# Patient Record
Sex: Male | Born: 1965 | Race: White | Hispanic: No | Marital: Married | Smoking: Never smoker
Health system: Southern US, Community
[De-identification: ages and names within clinical notes are randomized; demographics above are authoritative.]

## PROBLEM LIST (undated history)

## (undated) DIAGNOSIS — E785 Hyperlipidemia, unspecified: Secondary | ICD-10-CM

## (undated) DIAGNOSIS — I10 Essential (primary) hypertension: Secondary | ICD-10-CM

---

## 2007-02-28 ENCOUNTER — Emergency Department (HOSPITAL_COMMUNITY): Admission: EM | Admit: 2007-02-28 | Discharge: 2007-02-28 | Payer: Self-pay | Admitting: Emergency Medicine

## 2007-02-28 IMAGING — CR DG CHEST 2V
2 series · 2 of 2 positions shown · non-contrast
Comparison: None available.

CLINICAL DATA: Chest pain and dyspnea. 
 CHEST ? 2 VIEW:

[w chest pa]
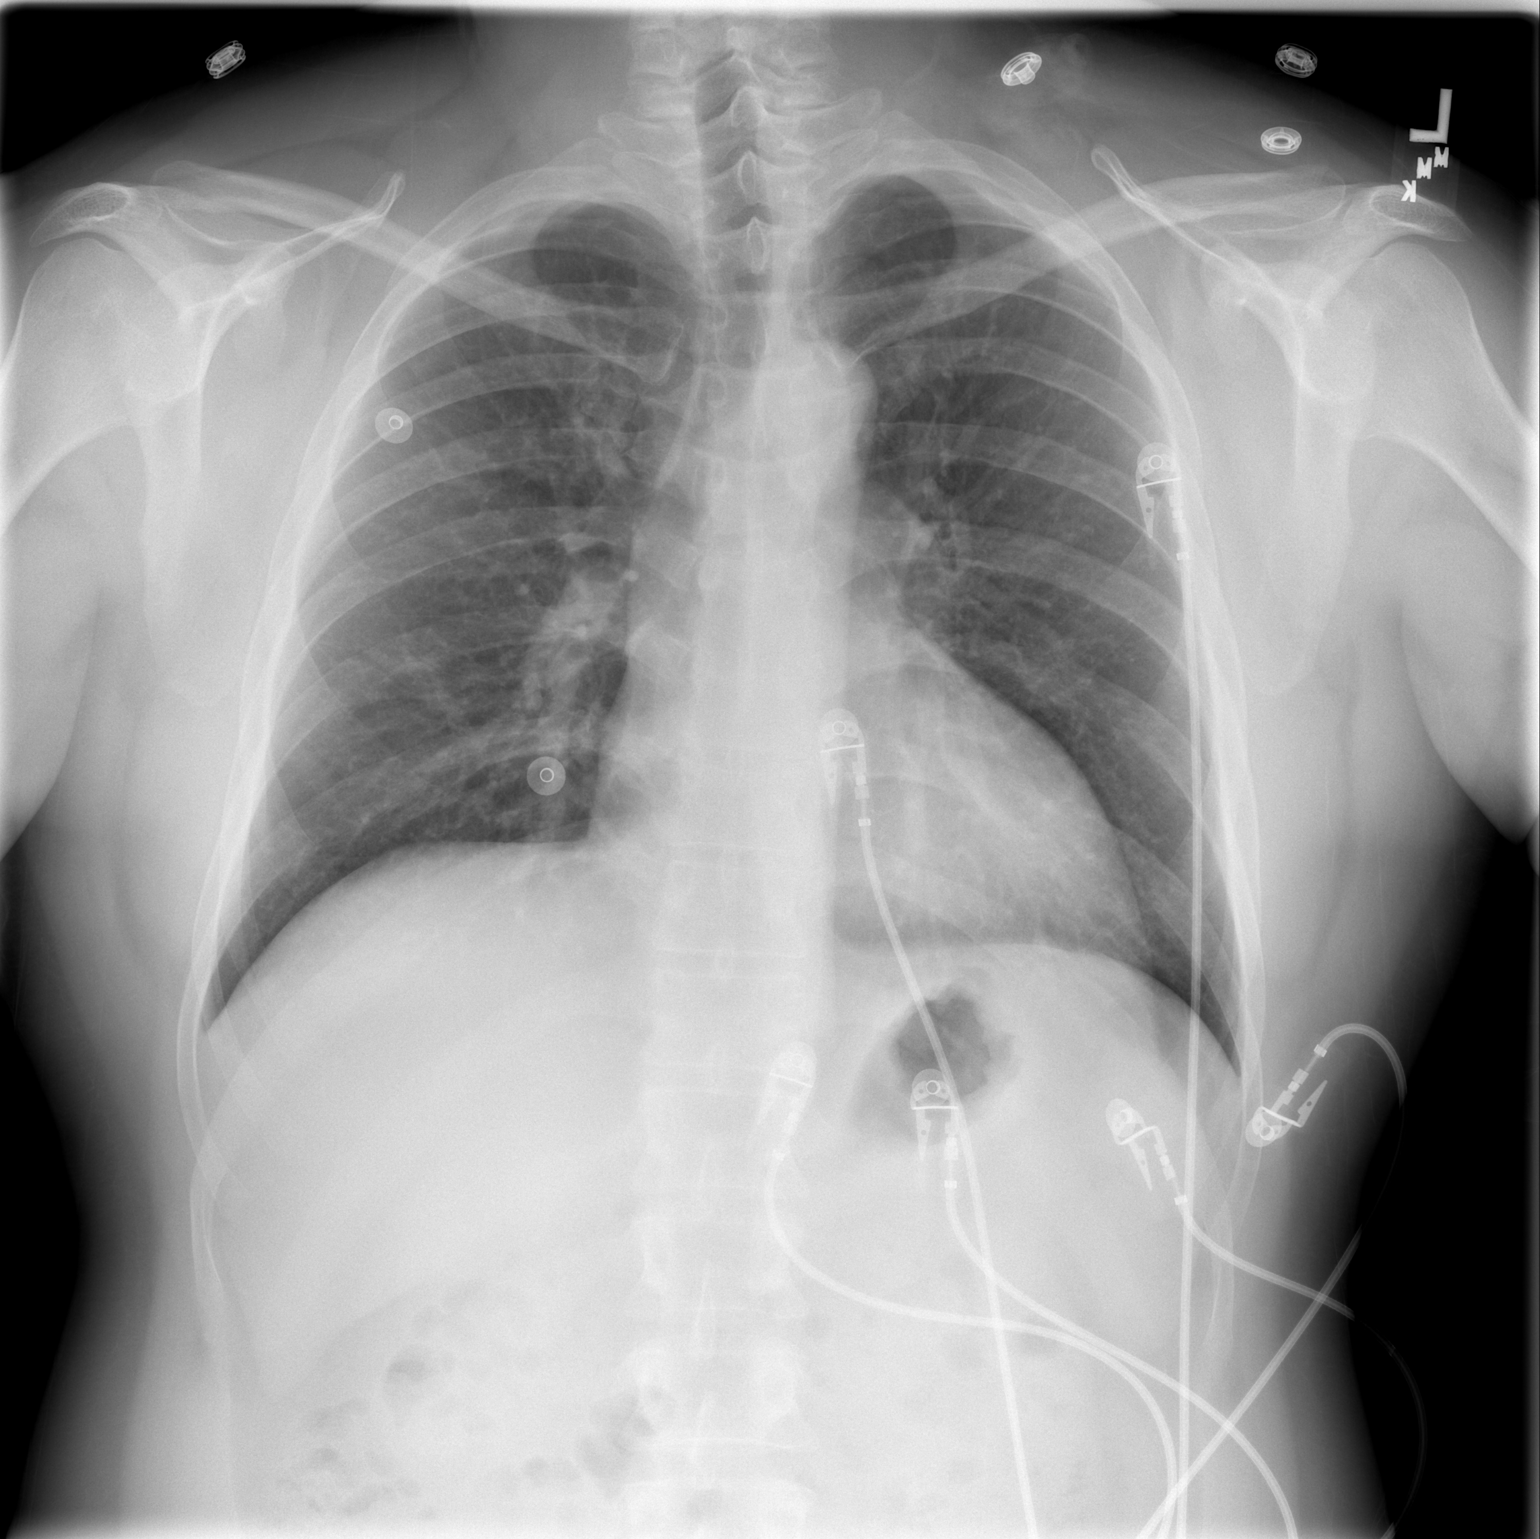

[w chest lat]
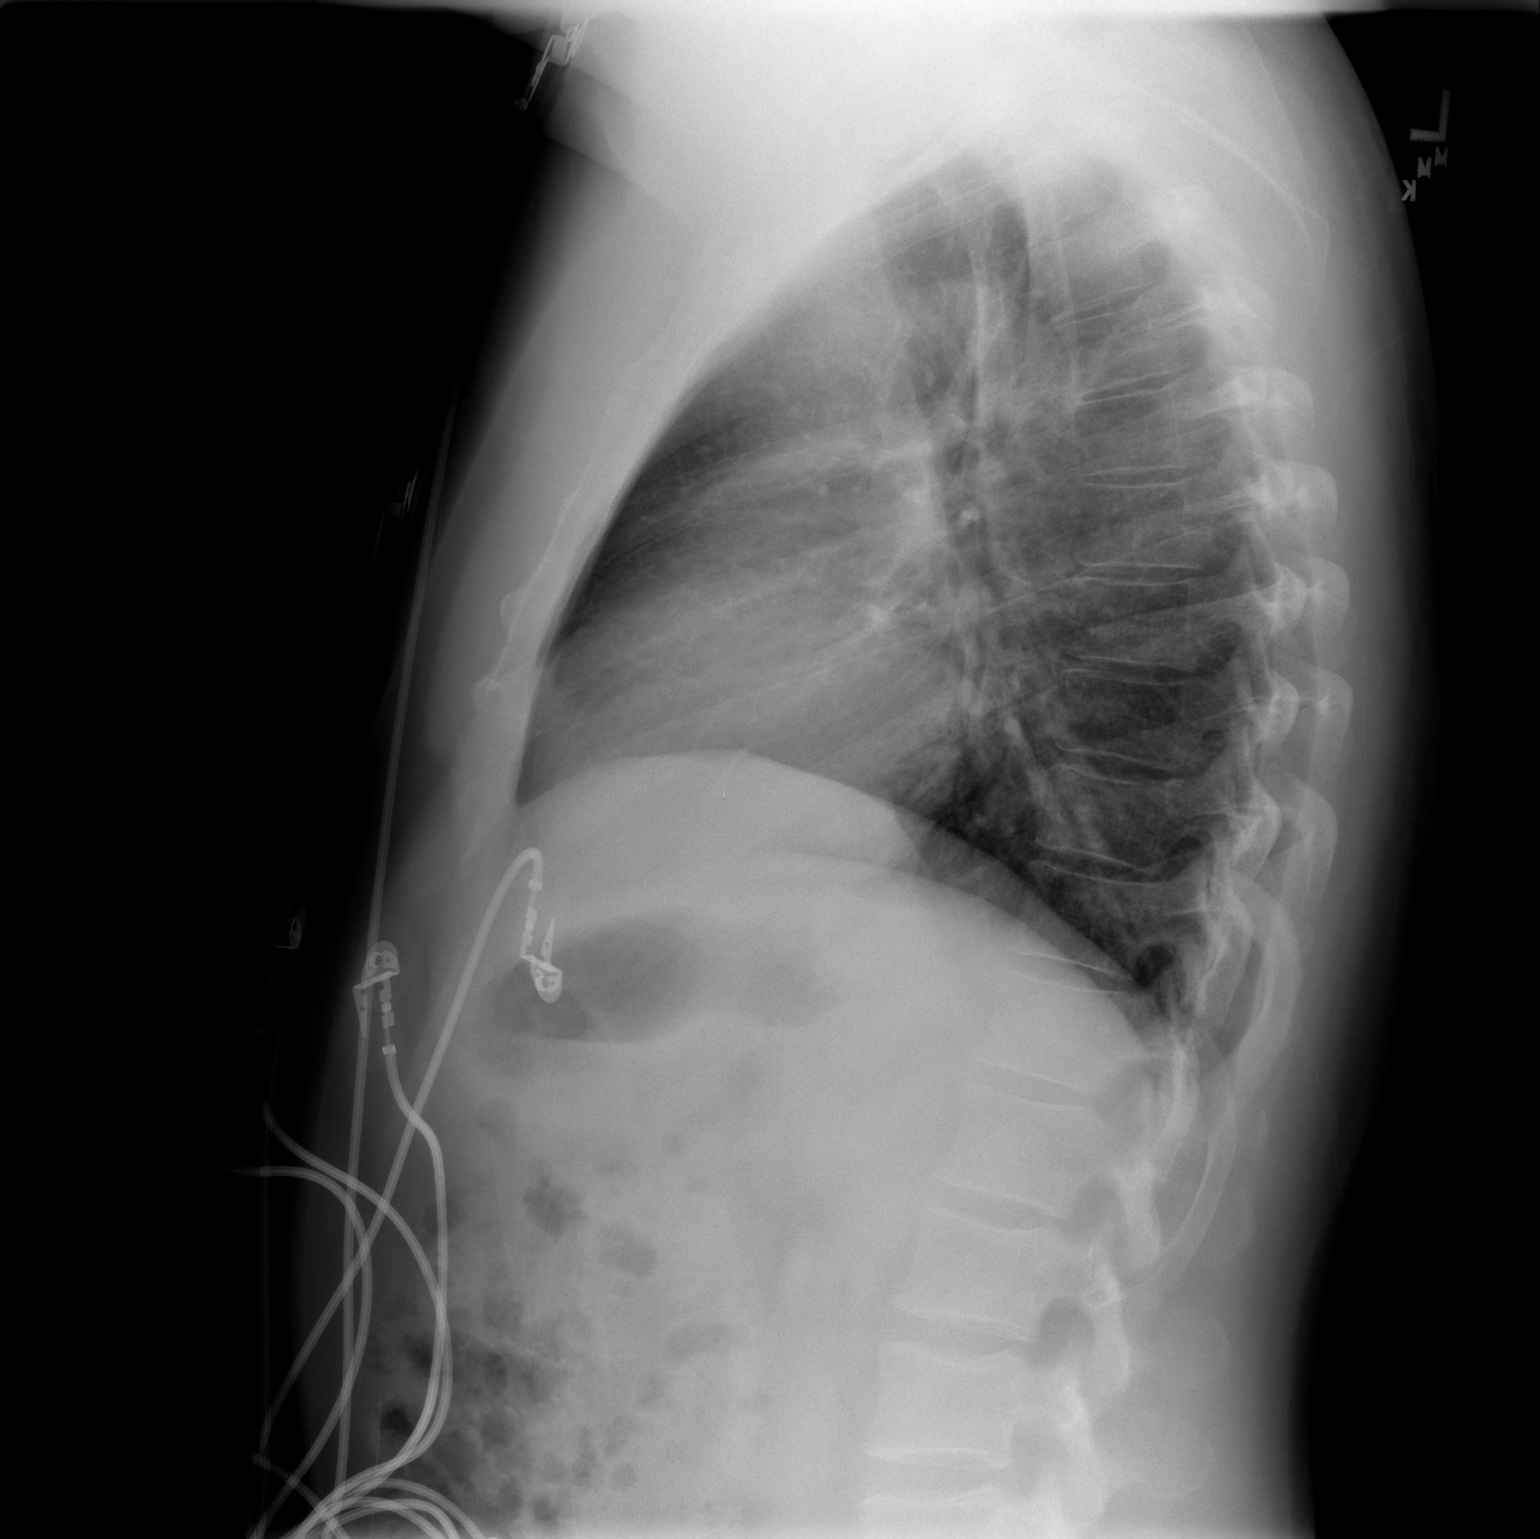

[2 of 2 positions shown; findings below may reference images not displayed]

FINDINGS: Mild cardiomegaly and mild peribronchial thickening are noted without focal airspace disease, pleural effusions or pneumothorax.  Lungs are otherwise clear.  The visualized bony thorax and upper abdomen are within normal limits.
IMPRESSION: Mild cardiomegaly and peribronchial thickening.

## 2011-03-18 LAB — CBC
Hemoglobin: 14.9
MCHC: 34.7
RDW: 12.5

## 2011-03-18 LAB — D-DIMER, QUANTITATIVE: D-Dimer, Quant: 0.3

## 2011-03-18 LAB — I-STAT 8, (EC8 V) (CONVERTED LAB)
Glucose, Bld: 99
Potassium: 4.4
Sodium: 139
TCO2: 27

## 2011-03-18 LAB — POCT I-STAT CREATININE: Operator id: 294501

## 2011-03-18 LAB — POCT CARDIAC MARKERS: Operator id: 294501

## 2011-06-07 ENCOUNTER — Other Ambulatory Visit: Payer: Self-pay | Admitting: Family Medicine

## 2011-06-07 DIAGNOSIS — R7989 Other specified abnormal findings of blood chemistry: Secondary | ICD-10-CM

## 2011-06-11 ENCOUNTER — Ambulatory Visit
Admission: RE | Admit: 2011-06-11 | Discharge: 2011-06-11 | Disposition: A | Discharge status: Home / Self Care | Payer: Self-pay | Source: Ambulatory Visit | Attending: Family Medicine | Admitting: Family Medicine

## 2011-06-11 DIAGNOSIS — R7989 Other specified abnormal findings of blood chemistry: Secondary | ICD-10-CM

## 2011-06-11 IMAGING — US US ABDOMEN COMPLETE
1 series · 14 of 25 positions shown · non-contrast
Comparison: None.

CLINICAL DATA: Elevated liver function tests.  The patient takes
cholesterol lowering medication (presumably a statin).

COMPLETE ABDOMINAL ULTRASOUND

[Series 1: us abdomen complete · 0.29mm/px · 14 of 70 slices shown]
[im 1/70]
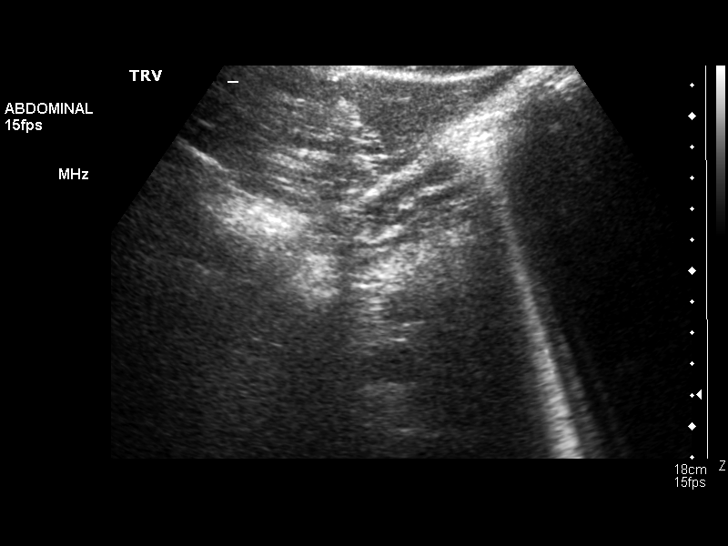
[im 6/70]
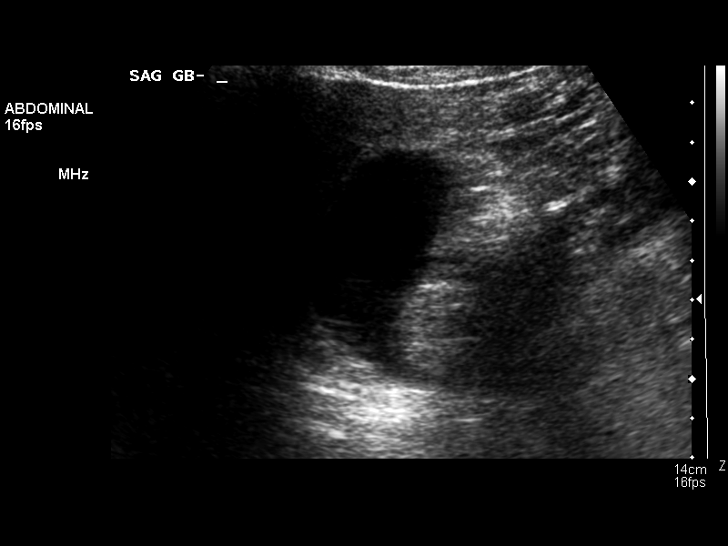
[im 12/70]
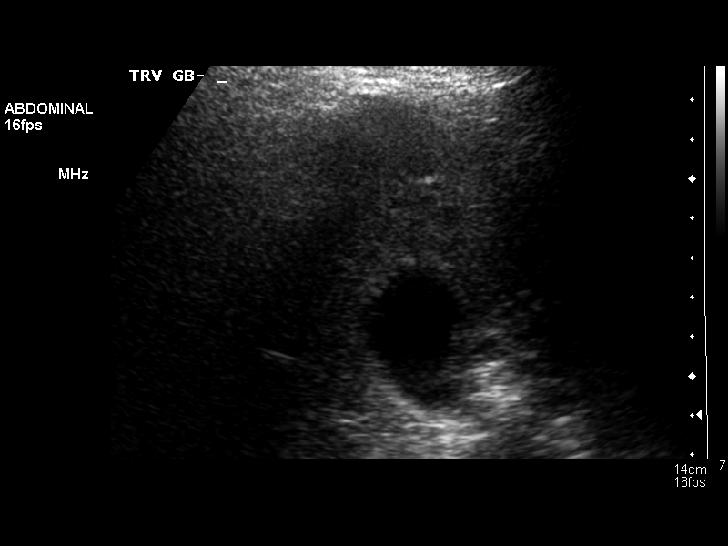
[im 18/70]
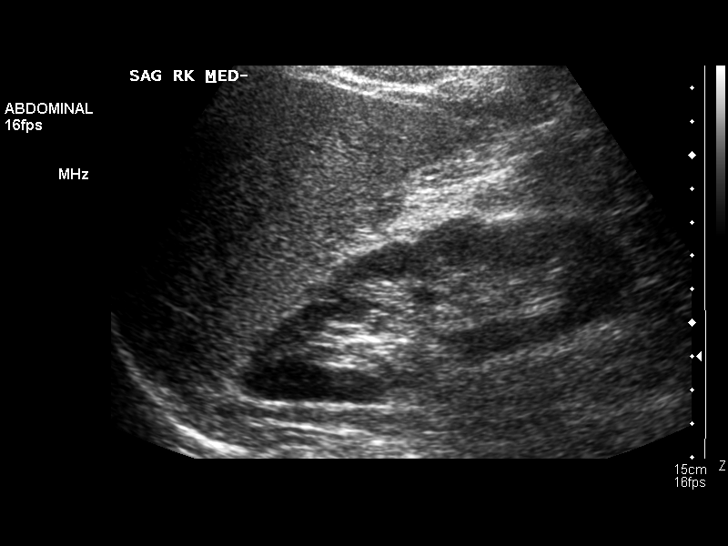
[im 24/70]
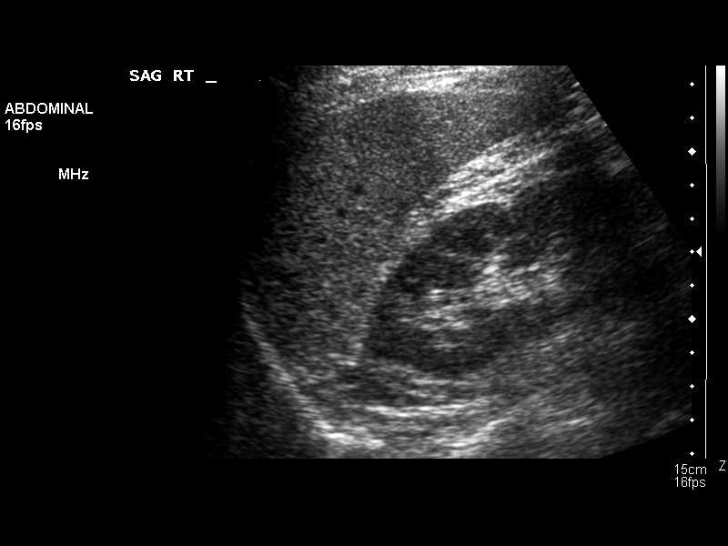
[im 26/70]
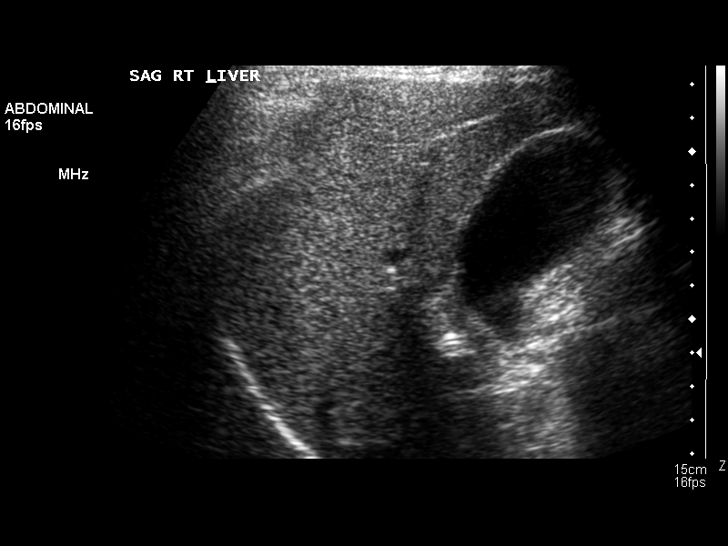
[im 32/70]
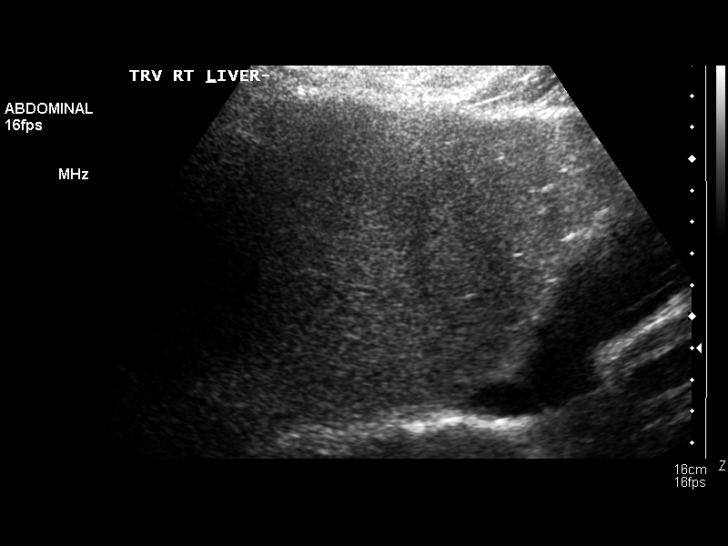
[im 38/70]
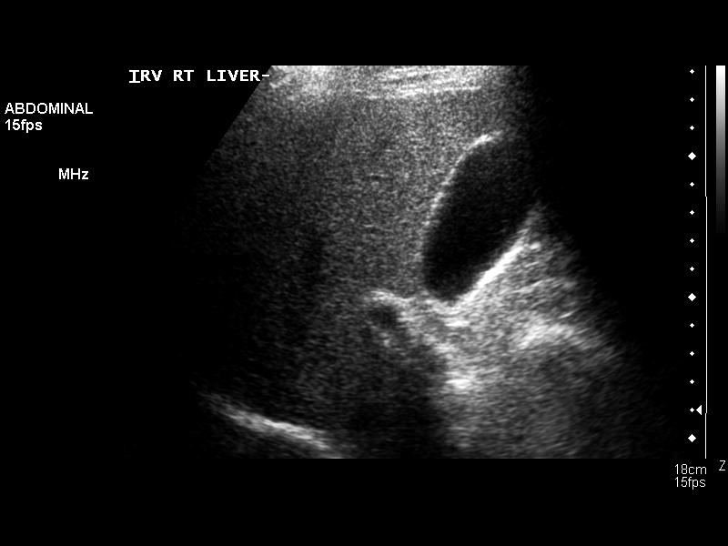
[im 44/70]
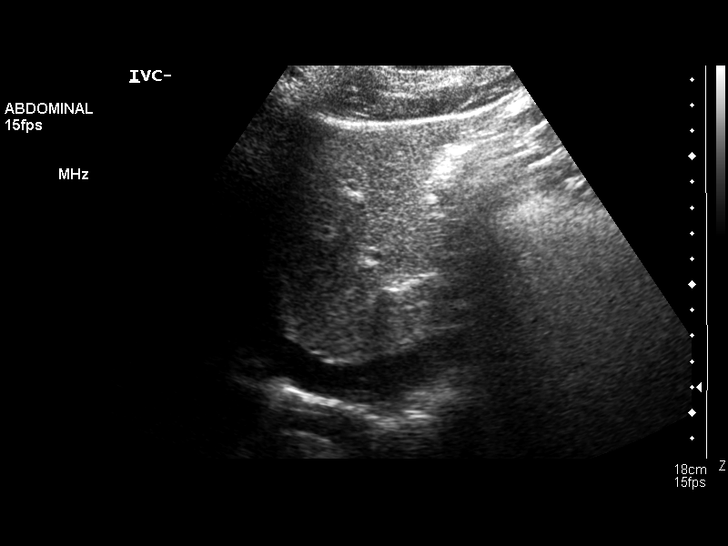
[im 47/70]
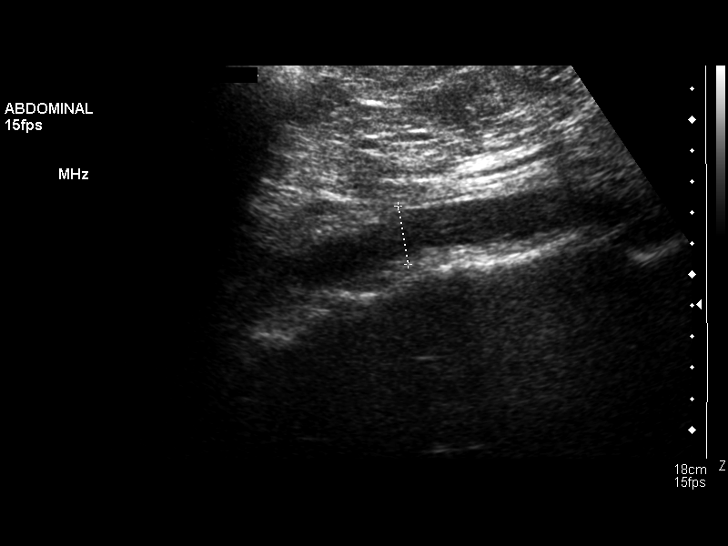
[im 52/70]
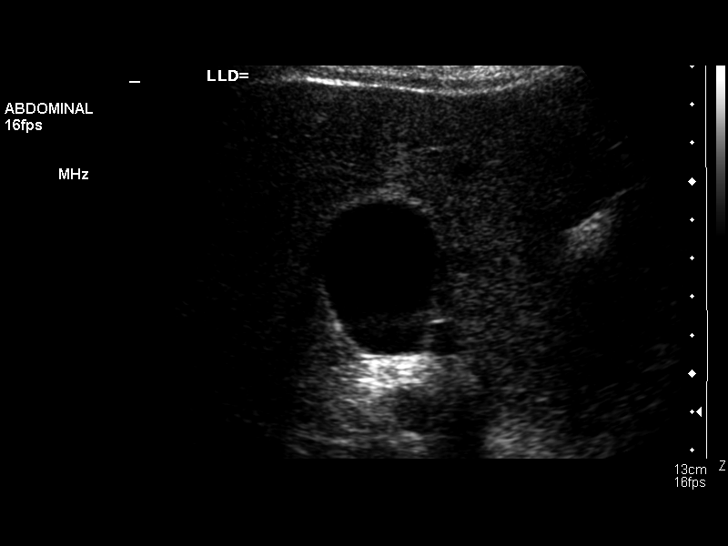
[im 58/70]
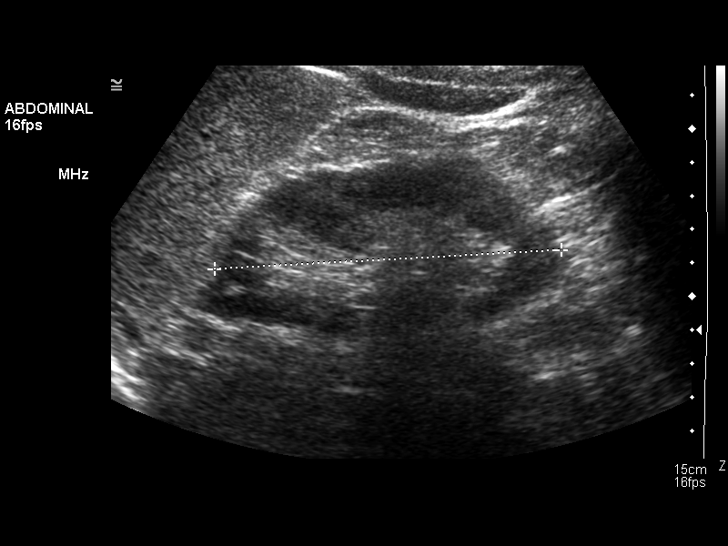
[im 64/70]
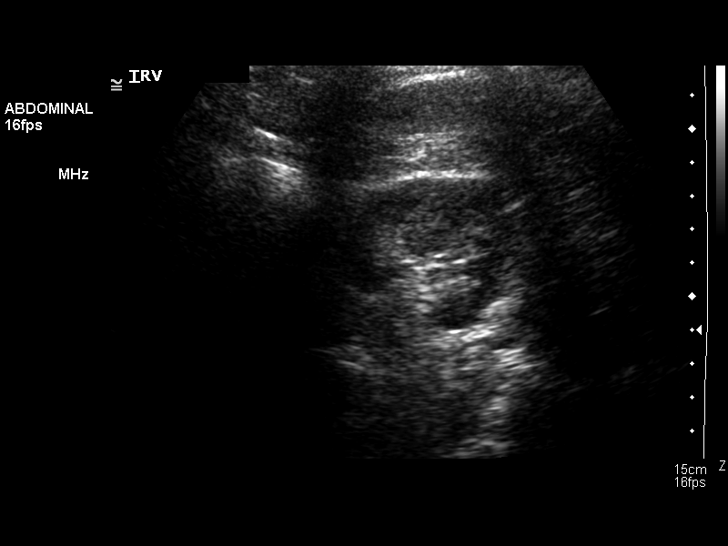
[im 70/70]
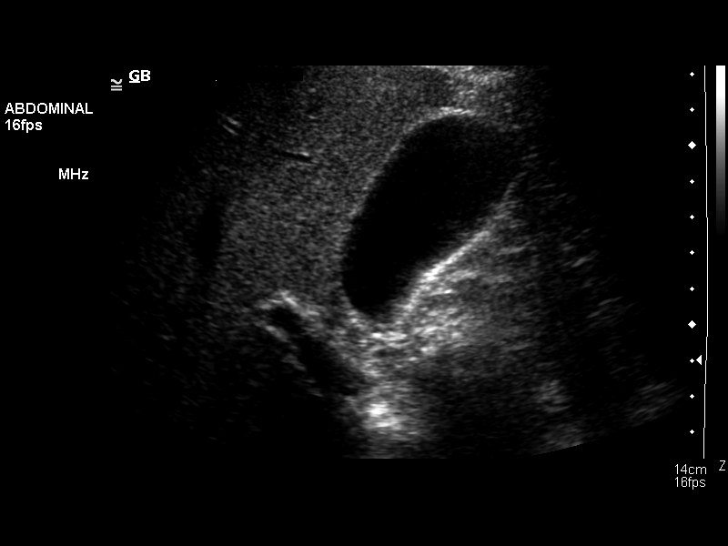

[14 of 25 positions shown; findings below may reference images not displayed]

FINDINGS: Gallbladder:  No gallstones, gallbladder wall thickening, or
pericholecystic fluid.

Common bile duct:  Measures 4 mm proximally, normal where
visualized.

Liver:  Mild coarsening of echotexture and header echogenicity,
possibly from mild hepatic steatosis.  No marginal nodularity or
focal abnormality is observed.

IVC:  Appears normal.

Pancreas:  Not well seen due to overlying bowel gas.

Spleen:  Measures 5.5 cm craniocaudad and appears normal.

Right Kidney:  Measures 12.2 cm craniocaudad and appears normal.

Left Kidney:  Measures 10.5 cm craniocaudad and appears normal.

Abdominal aorta:  No aneurysm identified.
IMPRESSION: 1.  Mildly coarsened echotexture of the liver, without nodularity
or focal lesion.  This could be from mild diffuse hepatic
steatosis.

## 2016-07-08 DIAGNOSIS — Z23 Encounter for immunization: Secondary | ICD-10-CM | POA: Diagnosis not present

## 2016-07-18 DIAGNOSIS — J45909 Unspecified asthma, uncomplicated: Secondary | ICD-10-CM | POA: Diagnosis not present

## 2016-07-18 DIAGNOSIS — R05 Cough: Secondary | ICD-10-CM | POA: Diagnosis not present

## 2016-10-13 DIAGNOSIS — Z0000 Encounter for general adult medical examination without abnormal findings: Secondary | ICD-10-CM | POA: Diagnosis not present

## 2016-10-15 DIAGNOSIS — Z0000 Encounter for general adult medical examination without abnormal findings: Secondary | ICD-10-CM | POA: Diagnosis not present

## 2016-10-28 IMAGING — US US ABDOMEN COMPLETE
1 series · 13 of 25 positions shown · non-contrast
Comparison: Abdominal ultrasound [DATE]

CLINICAL DATA: Episodica acute superior mid epigastric and back
pain for the past 2 months.

EXAM:
ABDOMEN ULTRASOUND COMPLETE

[Series 1: us abdomen complete · 0.15mm/px · 13 of 72 slices shown]
[im 1/72]
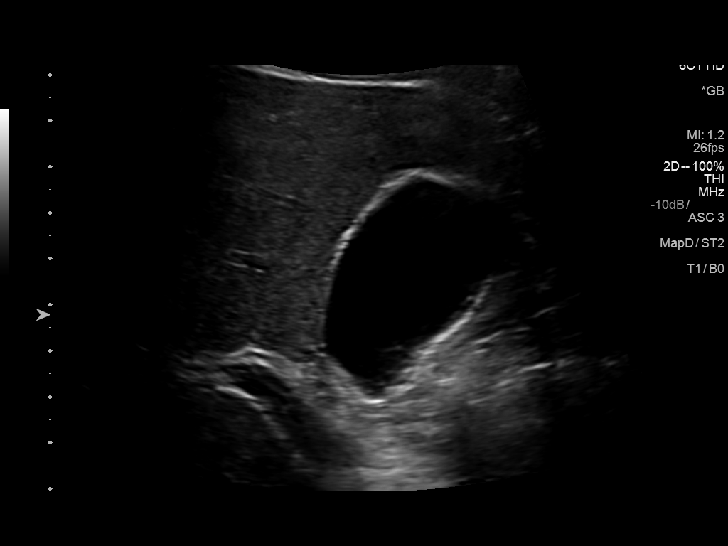
[im 6/72]
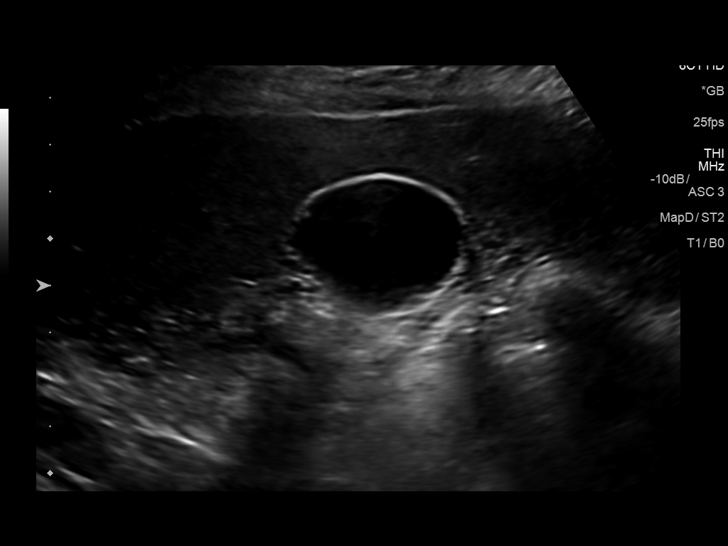
[im 12/72]
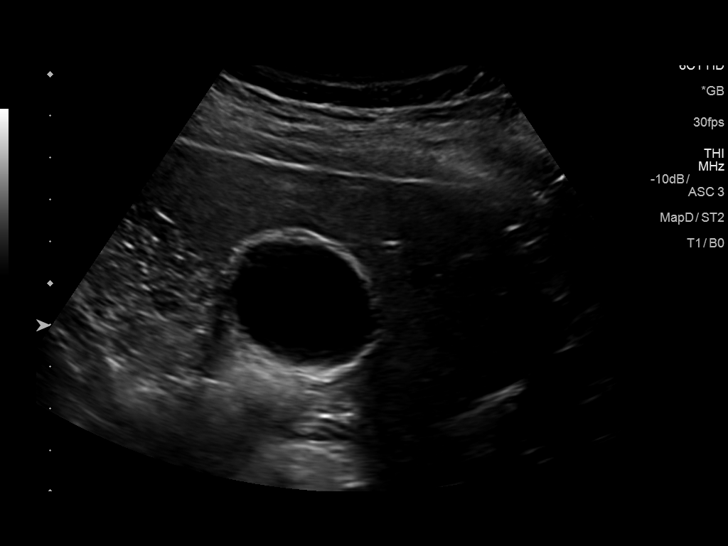
[im 18/72]
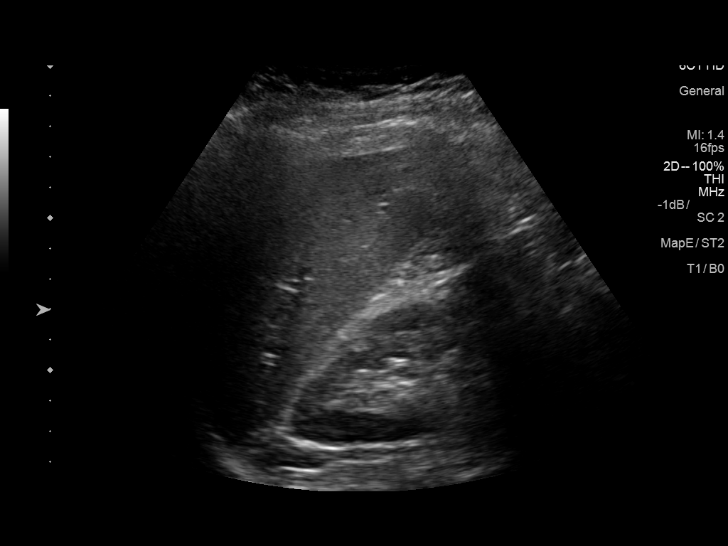
[im 24/72]
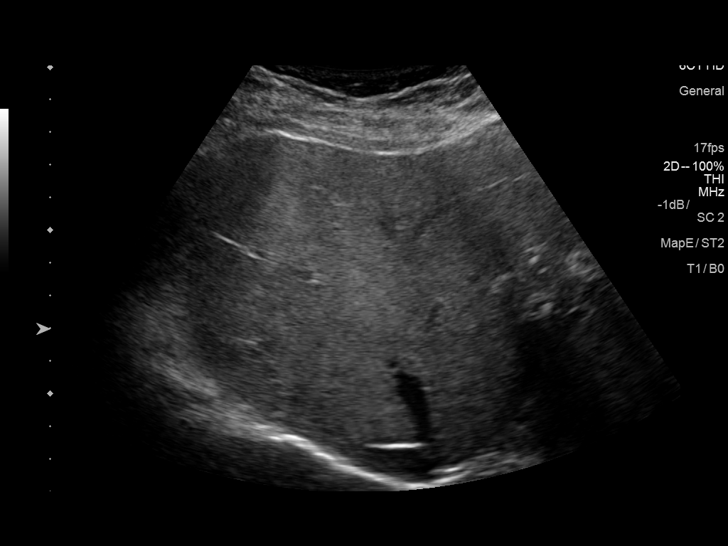
[im 30/72]
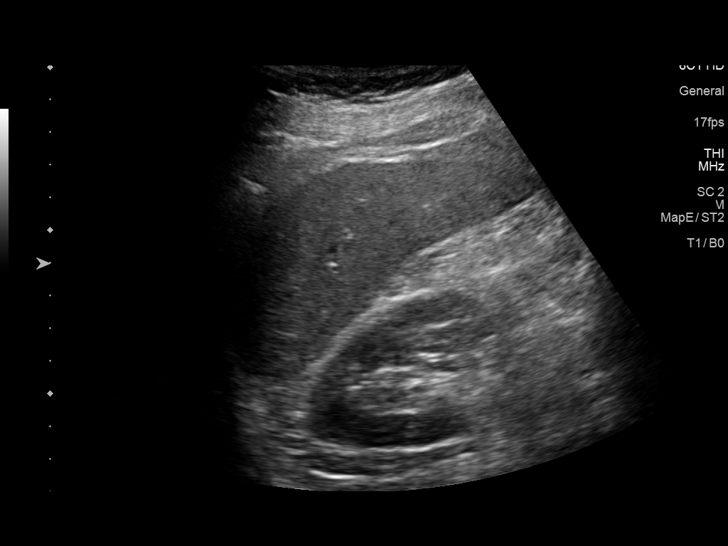
[im 36/72]
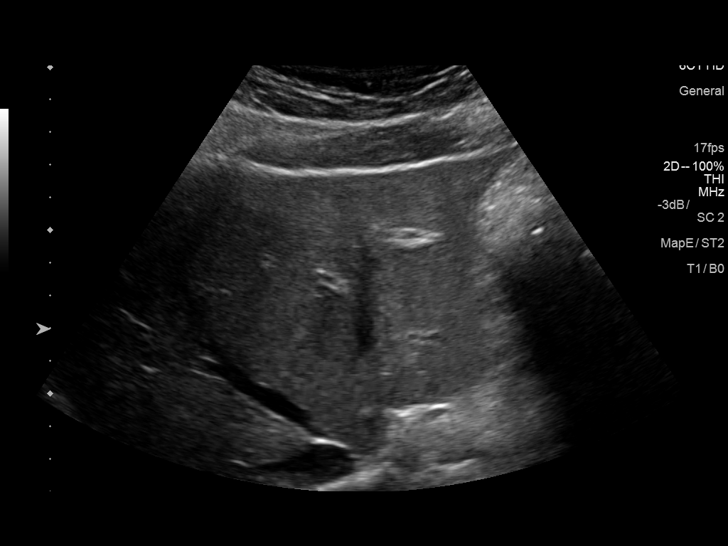
[im 42/72]
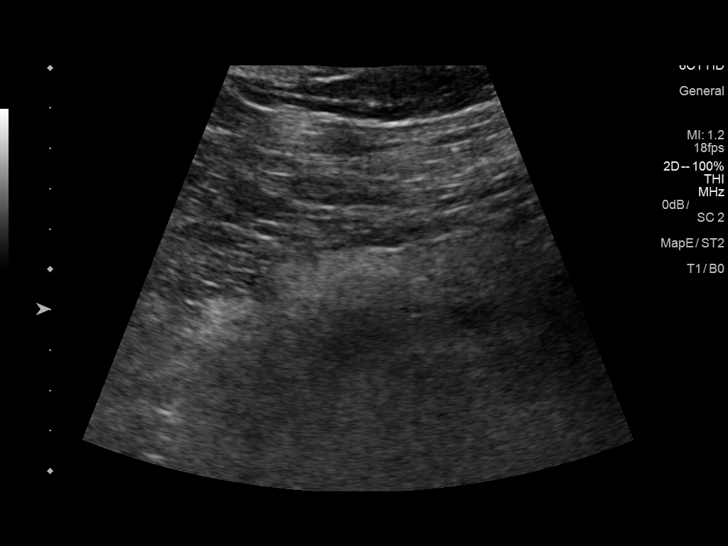
[im 48/72]
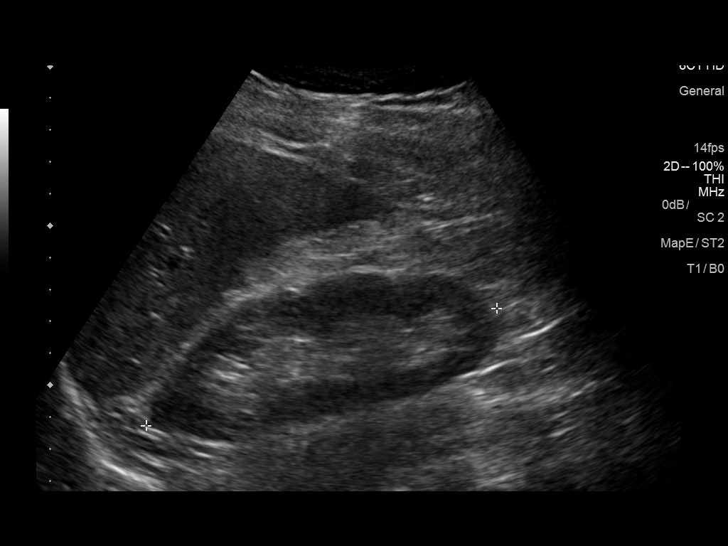
[im 54/72]
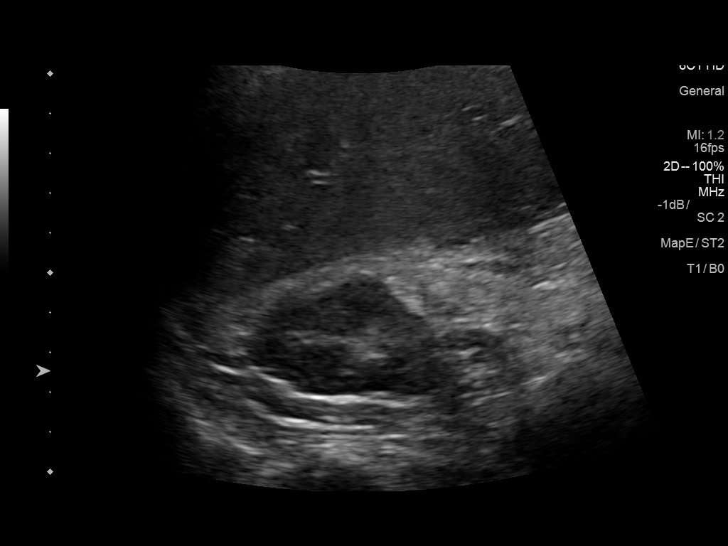
[im 60/72]
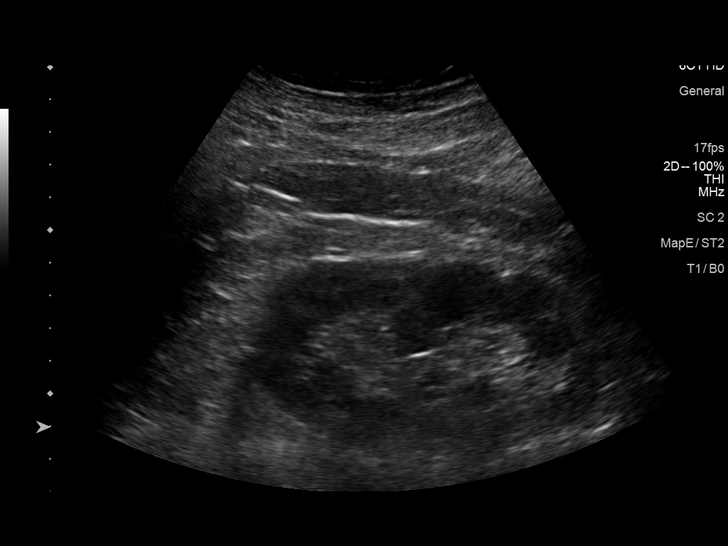
[im 66/72]
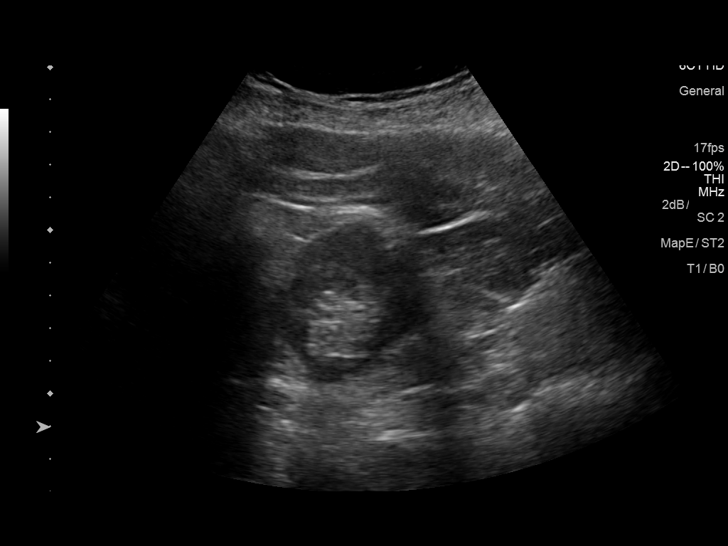
[im 72/72]
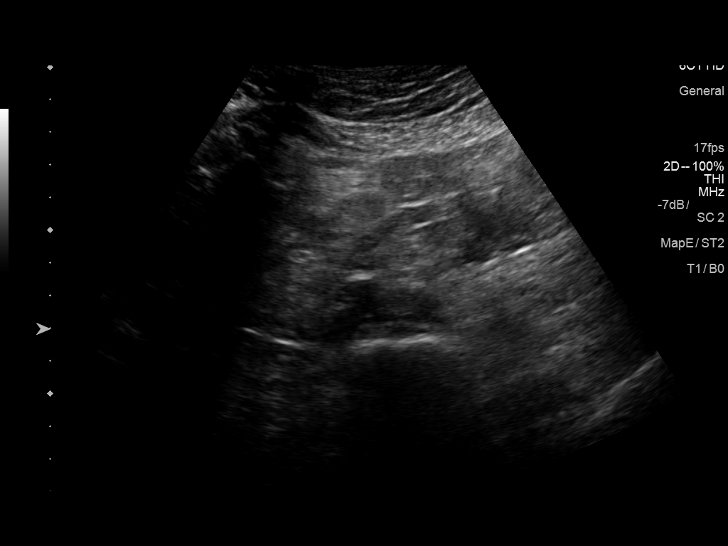

[13 of 25 positions shown; findings below may reference images not displayed]

FINDINGS: Gallbladder: No gallstones or wall thickening visualized. No
sonographic Murphy sign noted by sonographer.

Common bile duct: Diameter: 3.2 mm

Liver: The hepatic echotexture is mildly heterogeneous. There is no
discrete mass or ductal dilation. The surface contour remains
smooth. Portal vein is patent on color Doppler imaging with normal
direction of blood flow towards the liver.

IVC: Bowel gas obscures the infra hepatic portion of the IVC.

Pancreas: The pancreatic body is grossly normal. The pancreatic head
and tail are obscured by bowel gas.

Spleen: Size and appearance within normal limits.

Right Kidney: Length: 11.6 cm. Echogenicity within normal limits. No
mass or hydronephrosis visualized.

Left Kidney: Length: 11.1 cm. Echogenicity within normal limits. No
mass or hydronephrosis visualized.

Abdominal aorta: No aneurysm visualized.

Other findings: None.
IMPRESSION: Mildly heterogeneous hepatic echotexture may reflect fatty
infiltrative change or other hepatocellular disease. No suspicious
masses.

Normal appearance of the gallbladder. If there are clinical concerns
of gallbladder dysfunction, a nuclear medicine hepatobiliary scan
with gallbladder ejection fraction determination may be useful.

## 2017-01-10 DIAGNOSIS — K635 Polyp of colon: Secondary | ICD-10-CM | POA: Diagnosis not present

## 2017-01-10 DIAGNOSIS — Z1211 Encounter for screening for malignant neoplasm of colon: Secondary | ICD-10-CM | POA: Diagnosis not present

## 2017-05-24 DIAGNOSIS — Z23 Encounter for immunization: Secondary | ICD-10-CM | POA: Diagnosis not present

## 2017-10-24 DIAGNOSIS — Z13220 Encounter for screening for lipoid disorders: Secondary | ICD-10-CM | POA: Diagnosis not present

## 2017-10-26 DIAGNOSIS — Z0000 Encounter for general adult medical examination without abnormal findings: Secondary | ICD-10-CM | POA: Diagnosis not present

## 2017-11-18 DIAGNOSIS — J309 Allergic rhinitis, unspecified: Secondary | ICD-10-CM | POA: Diagnosis not present

## 2017-11-25 DIAGNOSIS — J309 Allergic rhinitis, unspecified: Secondary | ICD-10-CM | POA: Diagnosis not present

## 2017-12-02 DIAGNOSIS — J309 Allergic rhinitis, unspecified: Secondary | ICD-10-CM | POA: Diagnosis not present

## 2017-12-13 DIAGNOSIS — R1013 Epigastric pain: Secondary | ICD-10-CM | POA: Diagnosis not present

## 2017-12-13 DIAGNOSIS — R0789 Other chest pain: Secondary | ICD-10-CM | POA: Diagnosis not present

## 2017-12-19 ENCOUNTER — Other Ambulatory Visit: Payer: Self-pay | Admitting: Family Medicine

## 2017-12-19 DIAGNOSIS — R1013 Epigastric pain: Secondary | ICD-10-CM

## 2018-01-11 ENCOUNTER — Ambulatory Visit
Admission: RE | Admit: 2018-01-11 | Discharge: 2018-01-11 | Disposition: A | Discharge status: Home / Self Care | Payer: BLUE CROSS/BLUE SHIELD | Source: Ambulatory Visit | Attending: Family Medicine | Admitting: Family Medicine

## 2018-01-11 DIAGNOSIS — R1013 Epigastric pain: Secondary | ICD-10-CM

## 2018-02-28 DIAGNOSIS — Z23 Encounter for immunization: Secondary | ICD-10-CM | POA: Diagnosis not present

## 2018-10-25 ENCOUNTER — Ambulatory Visit: Payer: Self-pay

## 2018-11-14 ENCOUNTER — Ambulatory Visit: Payer: Self-pay

## 2020-01-20 ENCOUNTER — Other Ambulatory Visit: Payer: Self-pay

## 2020-01-20 ENCOUNTER — Encounter (HOSPITAL_BASED_OUTPATIENT_CLINIC_OR_DEPARTMENT_OTHER): Payer: Self-pay

## 2020-01-20 ENCOUNTER — Emergency Department (HOSPITAL_BASED_OUTPATIENT_CLINIC_OR_DEPARTMENT_OTHER): Payer: 59

## 2020-01-20 ENCOUNTER — Emergency Department (HOSPITAL_BASED_OUTPATIENT_CLINIC_OR_DEPARTMENT_OTHER)
Admission: EM | Admit: 2020-01-20 | Discharge: 2020-01-20 | Disposition: A | Discharge status: Home / Self Care | Payer: 59 | Attending: Emergency Medicine | Admitting: Emergency Medicine

## 2020-01-20 DIAGNOSIS — W293XXA Contact with powered garden and outdoor hand tools and machinery, initial encounter: Secondary | ICD-10-CM | POA: Insufficient documentation

## 2020-01-20 DIAGNOSIS — S61012A Laceration without foreign body of left thumb without damage to nail, initial encounter: Secondary | ICD-10-CM | POA: Insufficient documentation

## 2020-01-20 DIAGNOSIS — S61210A Laceration without foreign body of right index finger without damage to nail, initial encounter: Secondary | ICD-10-CM | POA: Diagnosis not present

## 2020-01-20 DIAGNOSIS — Y929 Unspecified place or not applicable: Secondary | ICD-10-CM | POA: Diagnosis not present

## 2020-01-20 DIAGNOSIS — Y999 Unspecified external cause status: Secondary | ICD-10-CM | POA: Insufficient documentation

## 2020-01-20 DIAGNOSIS — S60922A Unspecified superficial injury of left hand, initial encounter: Secondary | ICD-10-CM | POA: Diagnosis present

## 2020-01-20 DIAGNOSIS — Y939 Activity, unspecified: Secondary | ICD-10-CM | POA: Insufficient documentation

## 2020-01-20 DIAGNOSIS — Z5321 Procedure and treatment not carried out due to patient leaving prior to being seen by health care provider: Secondary | ICD-10-CM | POA: Insufficient documentation

## 2020-01-20 NOTE — ED Triage Notes (Signed)
Pt c/o laceration to base of thumb and index finger on L hand. Injury from a hedge clipper. Moist dressing applied in triage.

## 2020-01-20 NOTE — ED Notes (Signed)
Pt advised registration that he is going home.

## 2021-08-09 ENCOUNTER — Observation Stay: Payer: Managed Care, Other (non HMO)

## 2021-08-09 ENCOUNTER — Other Ambulatory Visit: Payer: Self-pay

## 2021-08-09 ENCOUNTER — Observation Stay
Admission: EM | Admit: 2021-08-09 | Discharge: 2021-08-11 | Disposition: A | Discharge status: Home / Self Care | Payer: Managed Care, Other (non HMO) | Attending: Family Medicine | Admitting: Family Medicine

## 2021-08-09 ENCOUNTER — Emergency Department: Payer: Managed Care, Other (non HMO)

## 2021-08-09 DIAGNOSIS — R42 Dizziness and giddiness: Secondary | ICD-10-CM | POA: Diagnosis not present

## 2021-08-09 DIAGNOSIS — Z20822 Contact with and (suspected) exposure to covid-19: Secondary | ICD-10-CM | POA: Diagnosis not present

## 2021-08-09 DIAGNOSIS — R569 Unspecified convulsions: Principal | ICD-10-CM

## 2021-08-09 DIAGNOSIS — E876 Hypokalemia: Secondary | ICD-10-CM | POA: Diagnosis present

## 2021-08-09 DIAGNOSIS — I10 Essential (primary) hypertension: Secondary | ICD-10-CM | POA: Diagnosis not present

## 2021-08-09 DIAGNOSIS — W19XXXA Unspecified fall, initial encounter: Secondary | ICD-10-CM

## 2021-08-09 HISTORY — DX: Essential (primary) hypertension: I10

## 2021-08-09 HISTORY — DX: Hyperlipidemia, unspecified: E78.5

## 2021-08-09 LAB — CBC WITH DIFFERENTIAL/PLATELET
Abs Immature Granulocytes: 0.06 10*3/uL (ref 0.00–0.07)
Basophils Absolute: 0.1 10*3/uL (ref 0.0–0.1)
Basophils Relative: 1 %
Eosinophils Absolute: 0.4 10*3/uL (ref 0.0–0.5)
Eosinophils Relative: 6 %
HCT: 42.4 % (ref 39.0–52.0)
Hemoglobin: 14.6 g/dL (ref 13.0–17.0)
Immature Granulocytes: 1 %
Lymphocytes Relative: 38 %
Lymphs Abs: 2.6 10*3/uL (ref 0.7–4.0)
MCH: 29.9 pg (ref 26.0–34.0)
MCHC: 34.4 g/dL (ref 30.0–36.0)
MCV: 86.7 fL (ref 80.0–100.0)
Monocytes Absolute: 0.4 10*3/uL (ref 0.1–1.0)
Monocytes Relative: 7 %
Neutro Abs: 3.2 10*3/uL (ref 1.7–7.7)
Neutrophils Relative %: 47 %
Platelets: 297 10*3/uL (ref 150–400)
RBC: 4.89 MIL/uL (ref 4.22–5.81)
RDW: 12.6 % (ref 11.5–15.5)
WBC: 6.7 10*3/uL (ref 4.0–10.5)
nRBC: 0 % (ref 0.0–0.2)

## 2021-08-09 LAB — RESP PANEL BY RT-PCR (FLU A&B, COVID) ARPGX2
Influenza A by PCR: NEGATIVE
Influenza B by PCR: NEGATIVE
SARS Coronavirus 2 by RT PCR: NEGATIVE

## 2021-08-09 LAB — TSH: TSH: 2.577 u[IU]/mL (ref 0.350–4.500)

## 2021-08-09 LAB — COMPREHENSIVE METABOLIC PANEL
ALT: 27 U/L (ref 0–44)
AST: 36 U/L (ref 15–41)
Albumin: 4.5 g/dL (ref 3.5–5.0)
Alkaline Phosphatase: 53 U/L (ref 38–126)
Anion gap: 12 (ref 5–15)
BUN: 16 mg/dL (ref 6–20)
CO2: 21 mmol/L — ABNORMAL LOW (ref 22–32)
Calcium: 9 mg/dL (ref 8.9–10.3)
Chloride: 105 mmol/L (ref 98–111)
Creatinine, Ser: 1.12 mg/dL (ref 0.61–1.24)
GFR, Estimated: >60 mL/min (ref >60)
Glucose, Bld: 121 mg/dL — ABNORMAL HIGH (ref 70–99)
Potassium: 3.4 mmol/L — ABNORMAL LOW (ref 3.5–5.1)
Sodium: 138 mmol/L (ref 135–145)
Total Bilirubin: 0.7 mg/dL (ref 0.3–1.2)
Total Protein: 7.8 g/dL (ref 6.5–8.1)

## 2021-08-09 LAB — CBG MONITORING, ED: Glucose-Capillary: 112 mg/dL — ABNORMAL HIGH (ref 70–99)

## 2021-08-09 LAB — MAGNESIUM: Magnesium: 2.1 mg/dL (ref 1.7–2.4)

## 2021-08-09 IMAGING — MR MR HEAD W/O CM
8 of 12 series · 28 of 48 positions shown · non-contrast
Comparison: Prior MRI, correlation is made with CT head [DATE]

CLINICAL DATA: Seizure, new onset

EXAM:
MRI HEAD WITHOUT CONTRAST
TECHNIQUE: Multiplanar, multiecho pulse sequences of the brain and surrounding
structures were obtained without intravenous contrast.

[Series 5: T1 · sagittal · 5.0mm · 0.62mm/px · 2 of 23 slices shown (1 of 2)]
[im 1/23]
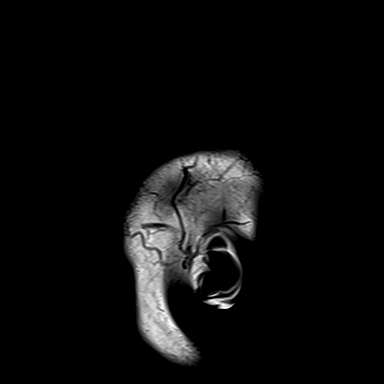
[im 23/23]
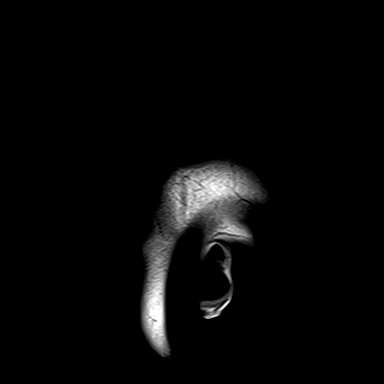

[Series 6: ax dwi_tracew · axial · 3.0mm · 0.65mm/px · z∈[-109,+42]mm · 4 of 48 slices shown]
[im 1/48]
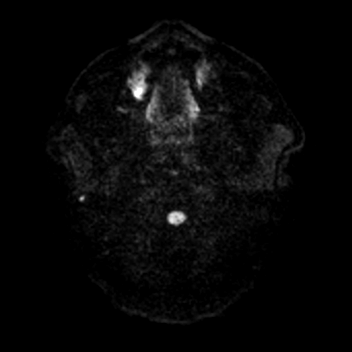
[im 16/48]
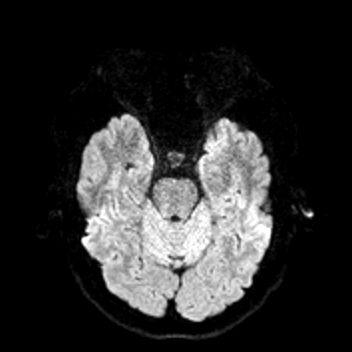
[im 32/48]
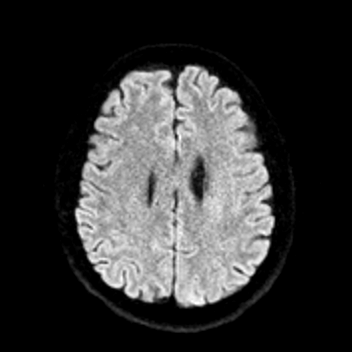
[im 48/48]
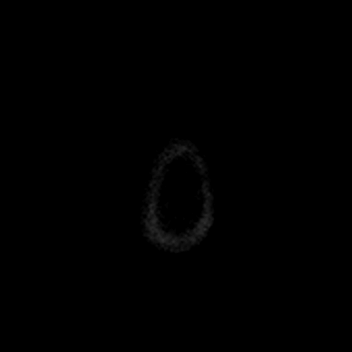

[Series 7: ax dwi_adc · axial · 3.0mm · 0.65mm/px · z∈[-109,-61]mm · 2 of 48 slices shown]
[im 1/48]
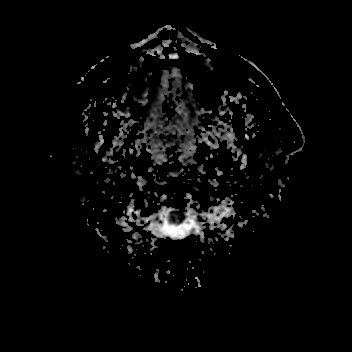
[im 16/48]
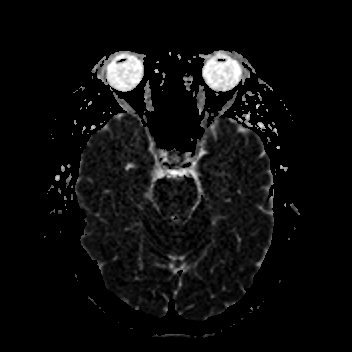

[Series 10: T2 · axial · 5.0mm · 0.53mm/px · z∈[-108,+44]mm · 2 of 27 slices shown (1 of 2)]
[im 1/27]
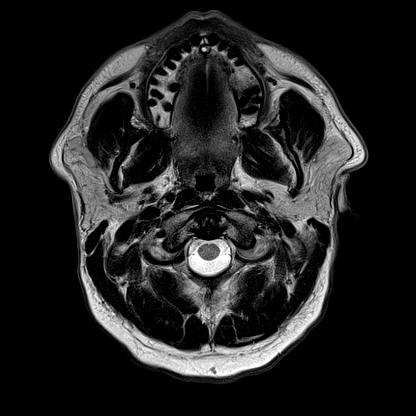
[im 27/27]
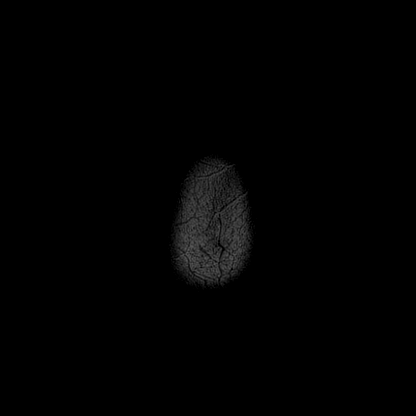

[Series 15: FLAIR · axial · 3.0mm · 0.53mm/px · z∈[-111,+46]mm · 4 of 55 slices shown (1 of 2)]
[im 1/55]
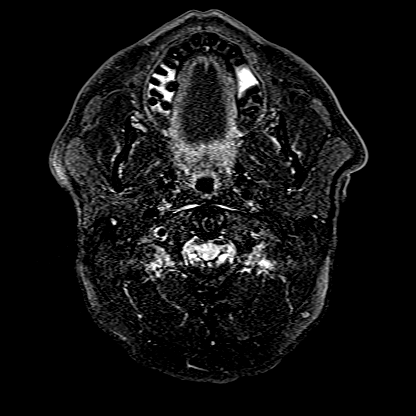
[im 19/55]
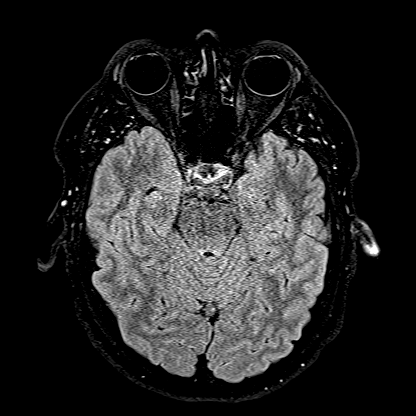
[im 37/55]
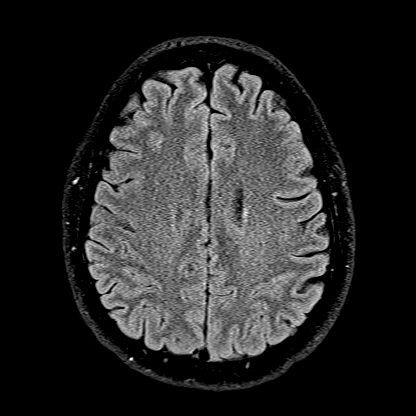
[im 55/55]
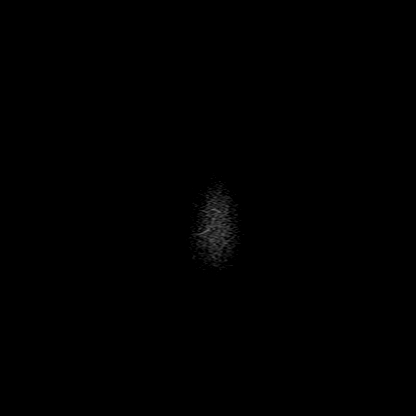

[Series 16: T1 · axial · 1.0mm · 0.98mm/px · z∈[-113,+41]mm · 8 of 160 slices shown (2 of 2)]
[im 1/160]
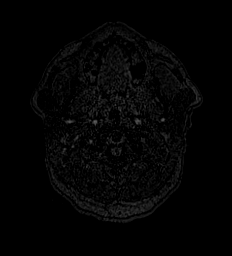
[im 29/160]
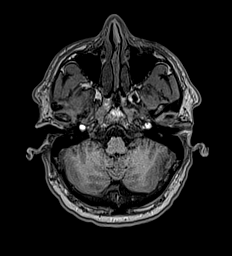
[im 44/160]
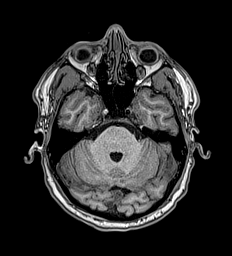
[im 73/160]
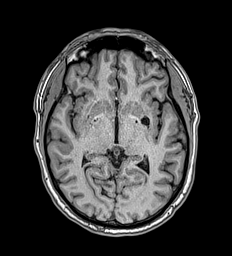
[im 87/160]
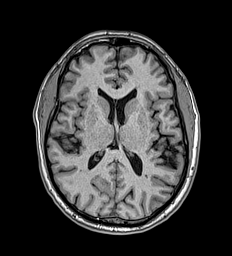
[im 116/160]
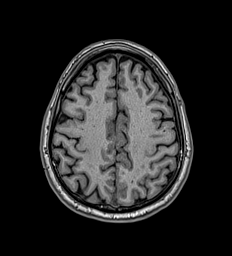
[im 131/160]
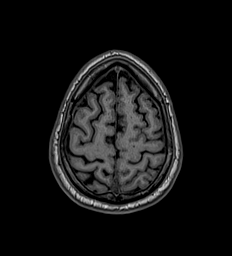
[im 160/160]
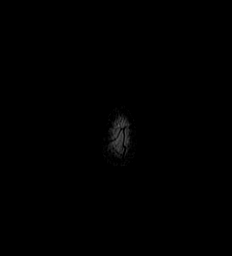

[Series 17: T2 · coronal · 3.0mm · 0.23mm/px · 3 of 35 slices shown (2 of 2)]
[im 1/35]
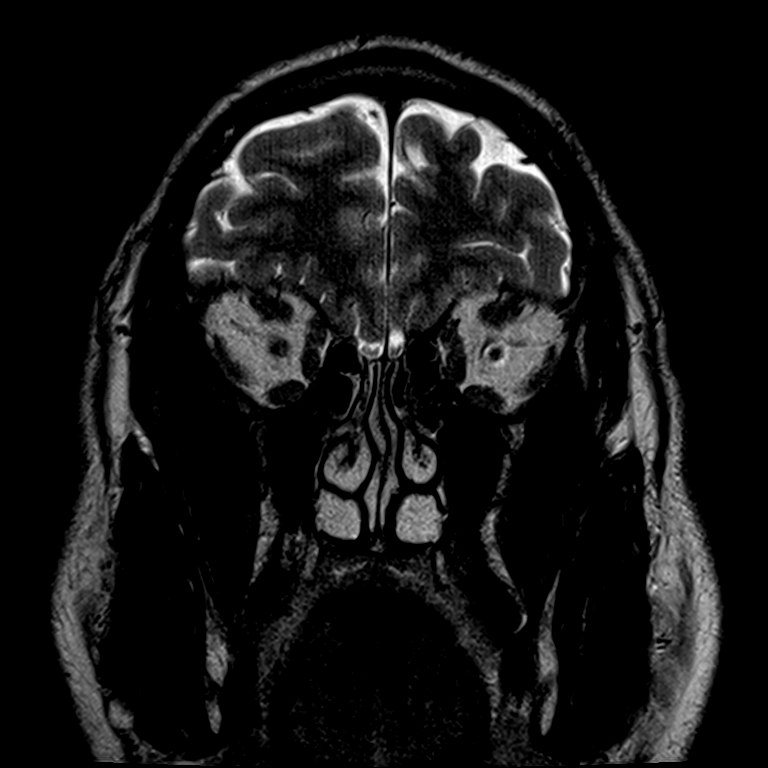
[im 18/35]
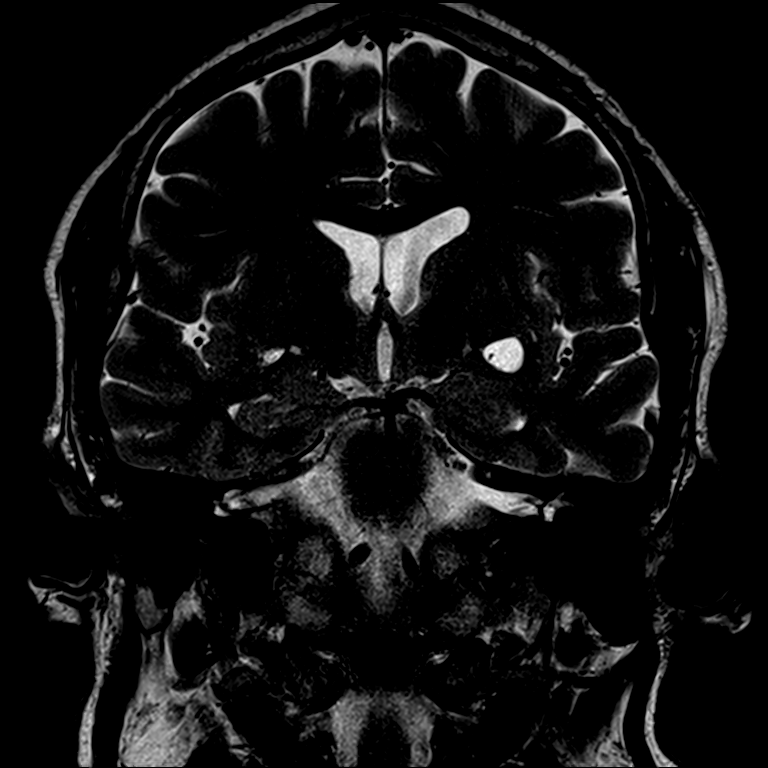
[im 35/35]
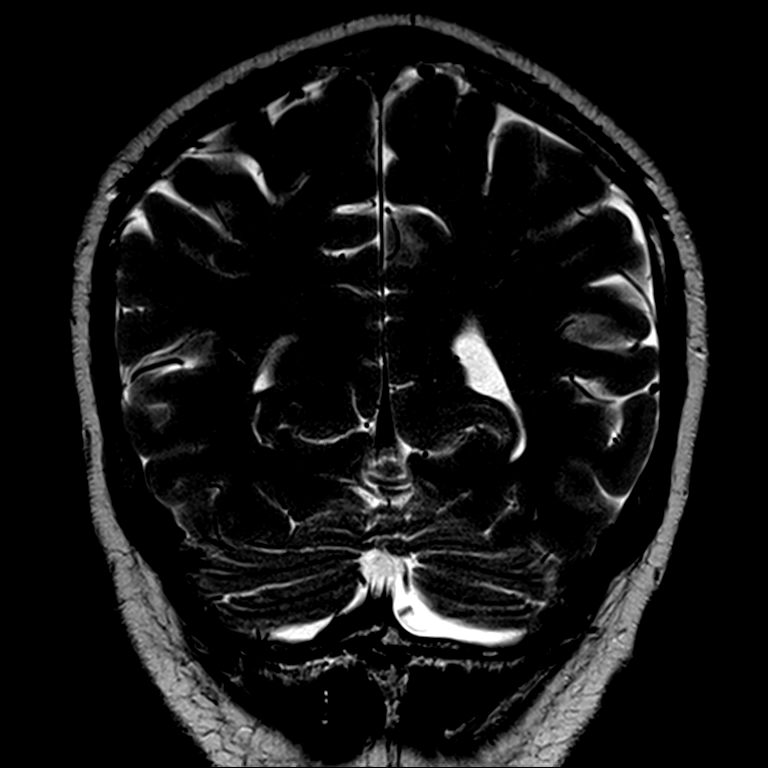

[Series 18: FLAIR · coronal · 3.0mm · 0.35mm/px · 3 of 35 slices shown (2 of 2)]
[im 1/35]
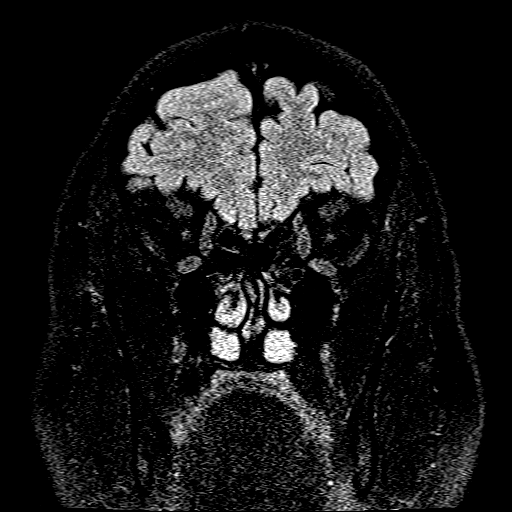
[im 18/35]
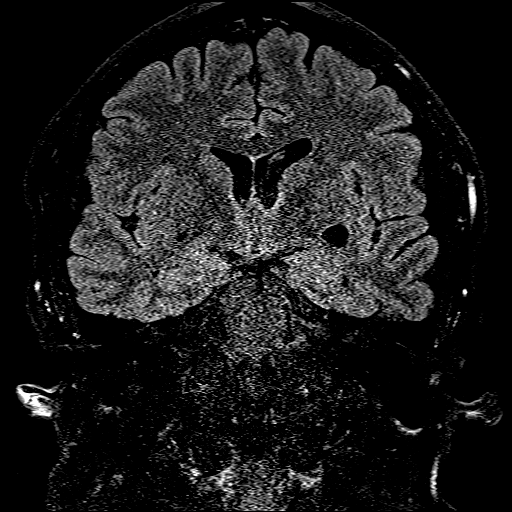
[im 35/35]
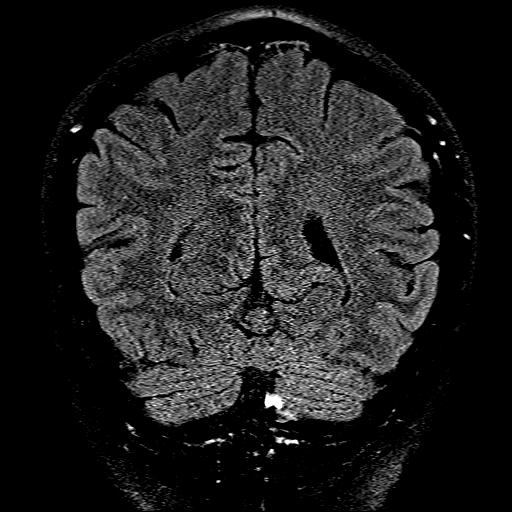

[28 of 48 positions shown; findings below may reference images not displayed]

FINDINGS: Brain: No restricted diffusion to suggest acute or subacute infarct.
No acute hemorrhage mass, mass effect, or midline shift. Dilated
perivascular spaces in the bilateral basal ganglia. No hydrocephalus
or extra-axial collection.

The hippocampi are symmetric in size and signal, although there may
be slightly increased T2 signal in the hippocampi bilaterally. No
heterotopia or cortical dysgenesis.

Vascular: Normal flow voids.

Skull and upper cervical spine: Normal marrow signal.

Sinuses/Orbits: Negative.

Other: The mastoids are well aerated.
IMPRESSION: 1. No definite seizure etiology is seen; however, possible mildly
increased T2 signal in the bilateral hippocampi may be related to
recent seizure.
2. No other acute intracranial process.

## 2021-08-09 IMAGING — CT CT HEAD W/O CM
4 series · 16 of 47 positions shown, 18 images · non-contrast
Comparison: None.

CLINICAL DATA: Seizure, new onset.



[Series 2: head wo · axial · 0.46mm/px · z∈[-80,+35]mm · 7 of 31 slices shown, 9 images]
[im 4/31  brain]
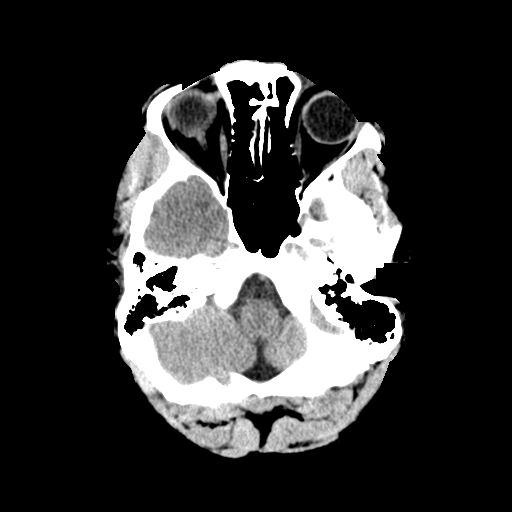
[im 4/31  bone]
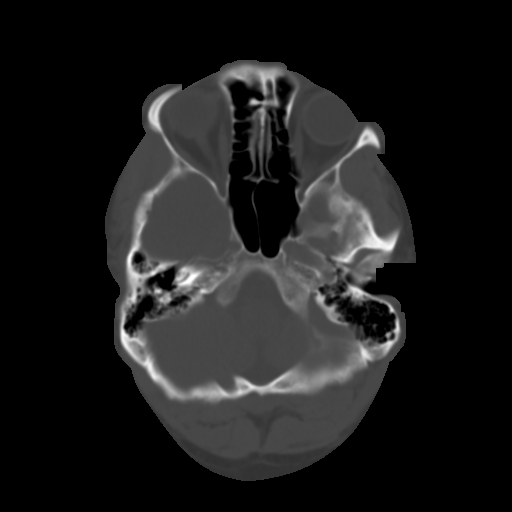
[im 8/31  brain]
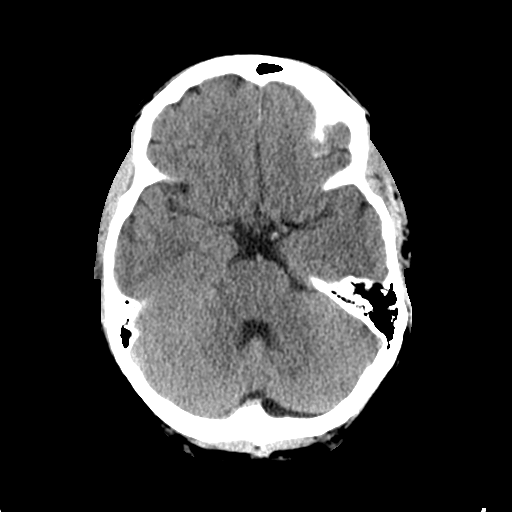
[im 12/31  brain]
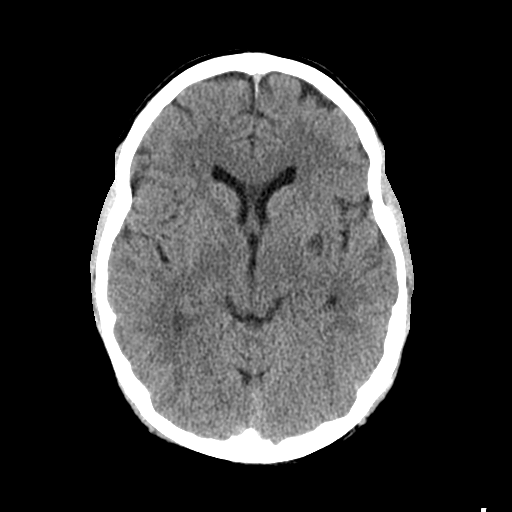
[im 16/31  brain]
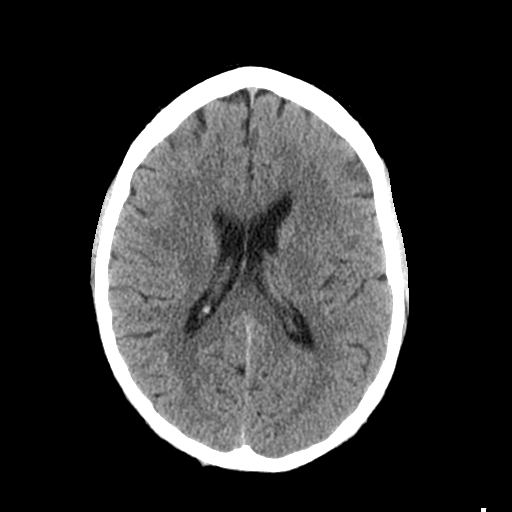
[im 19/31  brain]
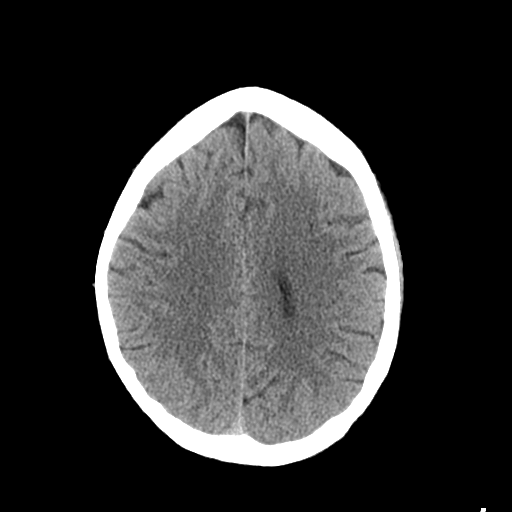
[im 19/31  bone]
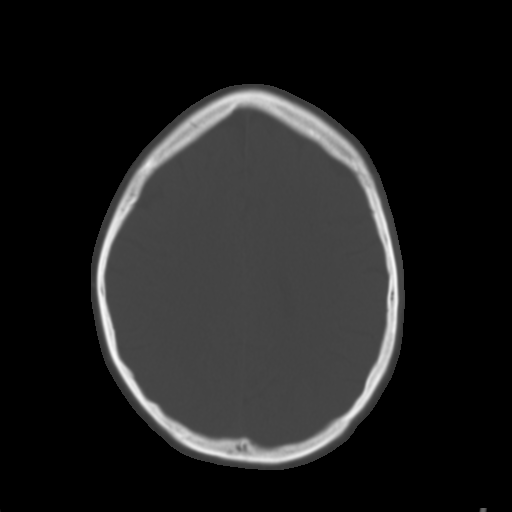
[im 23/31  brain]
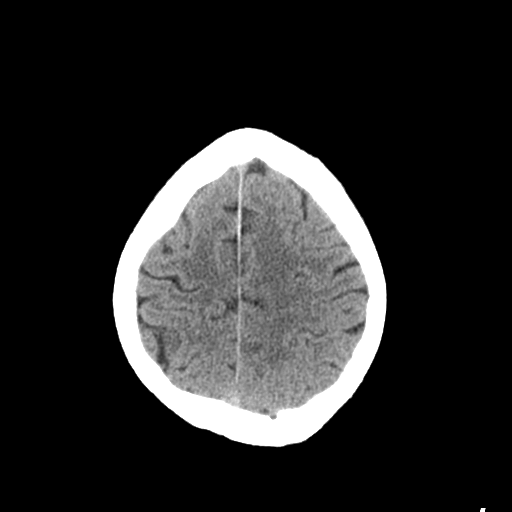
[im 27/31  brain]
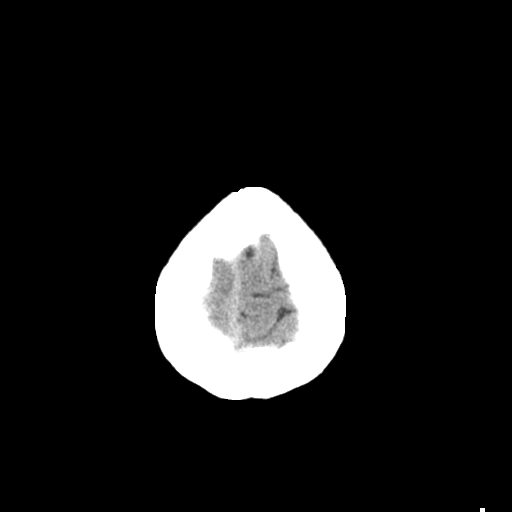

[Series 3: head bone · axial · 0.46mm/px · z∈[-81,-49]mm · 3 of 78 slices shown]
[im 8/78  bone]
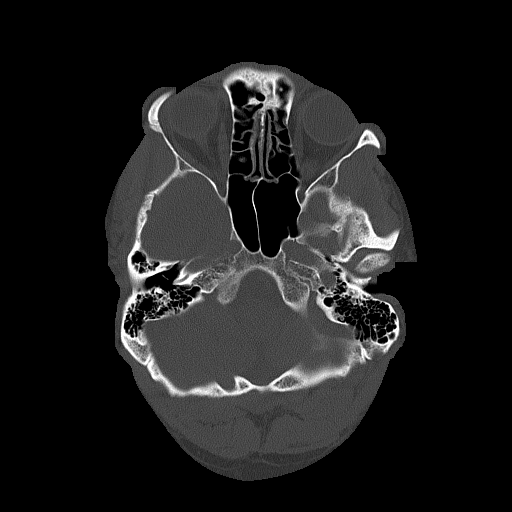
[im 16/78  bone]
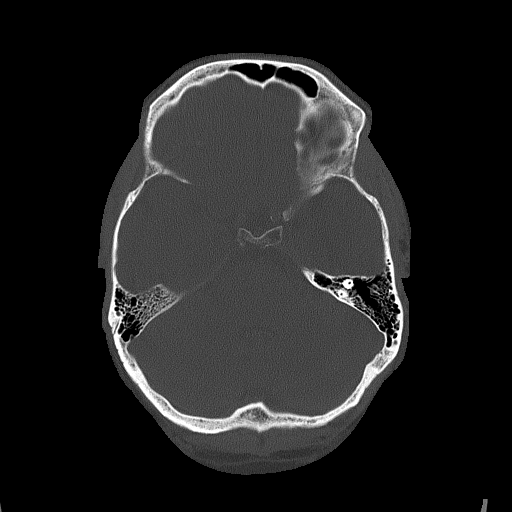
[im 24/78  bone]
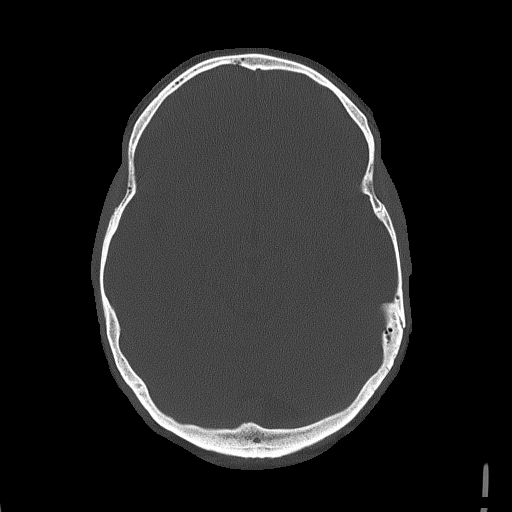

[Series 4: coronal soft tissue · coronal · 0.33mm/px · 3 of 69 slices shown]
[im 23/69  brain]
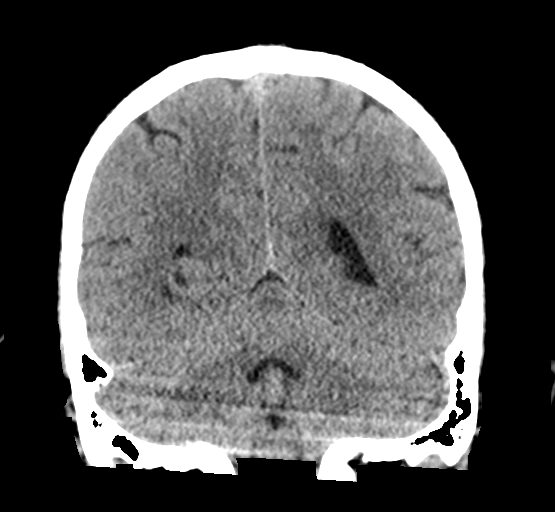
[im 31/69  brain]
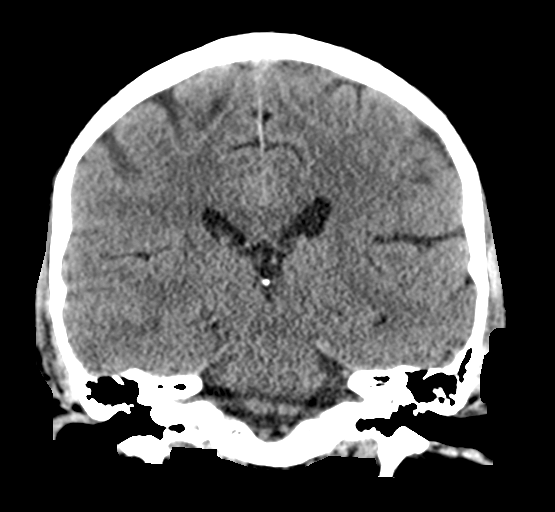
[im 38/69  brain]
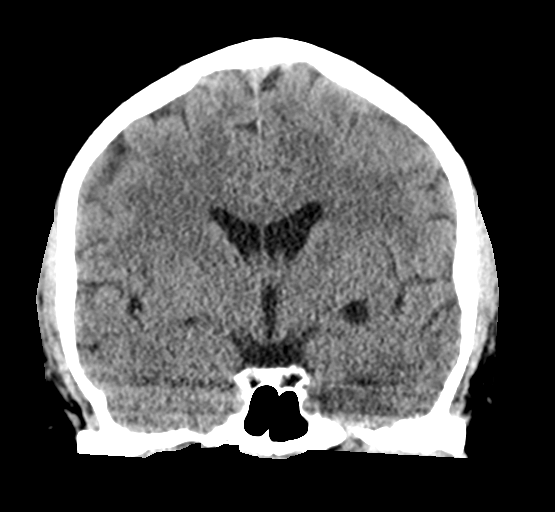

[Series 5: sagittal soft tissue · sagittal · 0.33mm/px · 3 of 58 slices shown]
[im 20/58  brain]
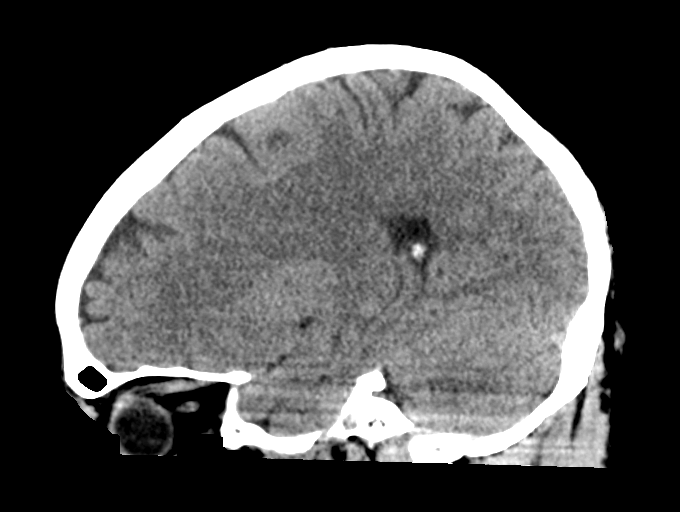
[im 29/58  brain]
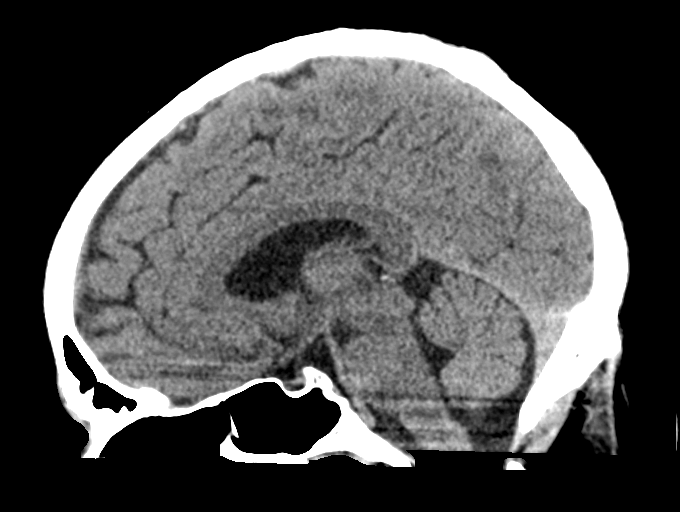
[im 39/58  brain]
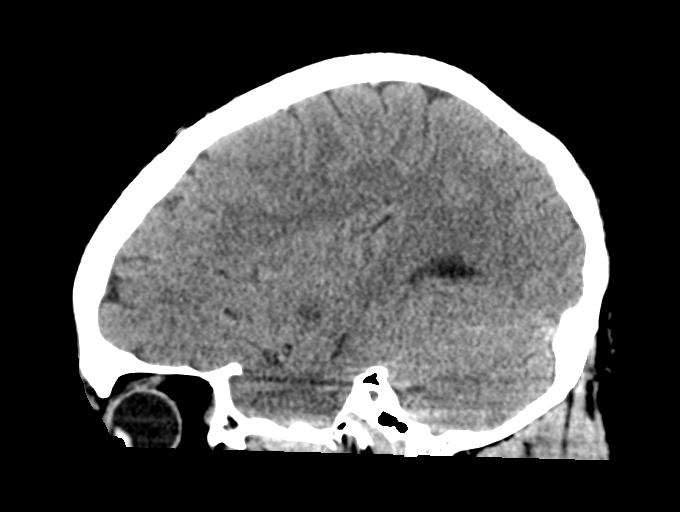

[16 of 47 positions shown; findings below may reference images not displayed]

FINDINGS: CT HEAD FINDINGS

Brain: No evidence of acute infarction, hemorrhage, hydrocephalus,
extra-axial collection or mass lesion/mass effect.

Vascular: No hyperdense vessel or unexpected calcification.

Skull: Normal. Negative for fracture or focal lesion.

Sinuses/Orbits: Visualized globes and orbits are unremarkable.
Visualized sinuses are clear.

Other: None.

CT CERVICAL SPINE FINDINGS

Alignment: Normal.

Skull base and vertebrae: No acute fracture. No primary bone lesion
or focal pathologic process.

Soft tissues and spinal canal: No prevertebral fluid or swelling. No
visible canal hematoma.

Disc levels: Mild loss of disc height at C4-C5. Minor loss of disc
height at C5-C6 and C6-C7. No significant disc bulging. No evidence
of a disc herniation. No stenosis.

Upper chest: Negative.

Other: None.
IMPRESSION: HEAD CT

1. Normal.

CERVICAL CT

1. No fracture or acute finding.

## 2021-08-09 IMAGING — CT CT CERVICAL SPINE W/O CM
3 of 4 series · 13 of 33 positions shown, 16 images · non-contrast
Comparison: None.

CLINICAL DATA: Seizure, new onset.



[Series 4: sagittal bone · sagittal · 0.27mm/px · 5 of 66 slices shown, 6 images]
[im 22/66  bone]
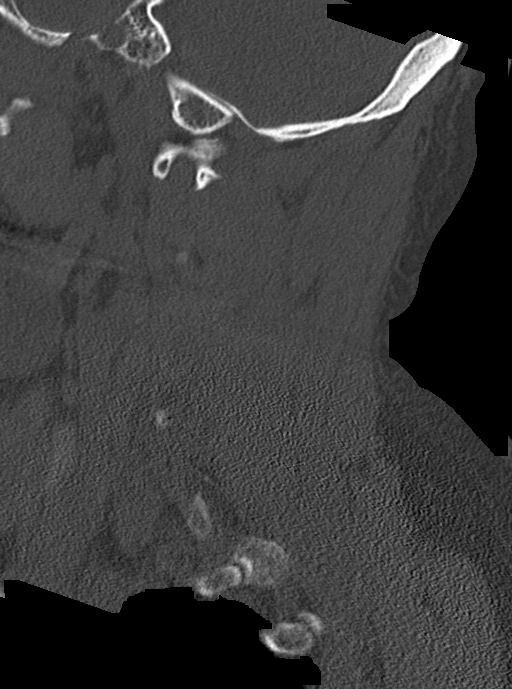
[im 28/66  bone]
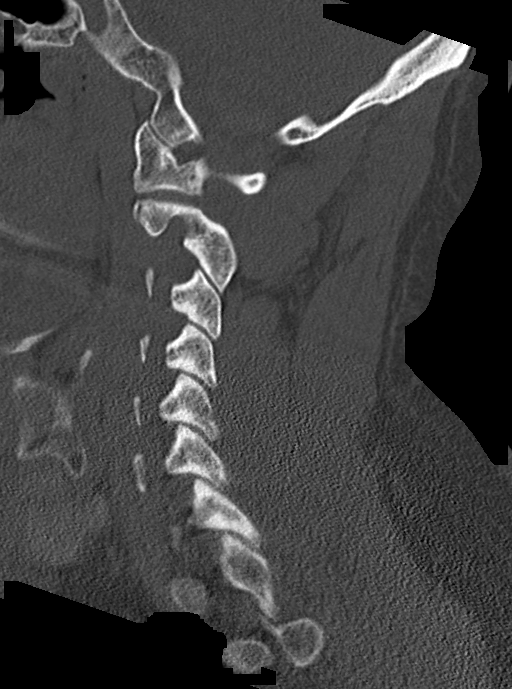
[im 33/66  soft-tissue]
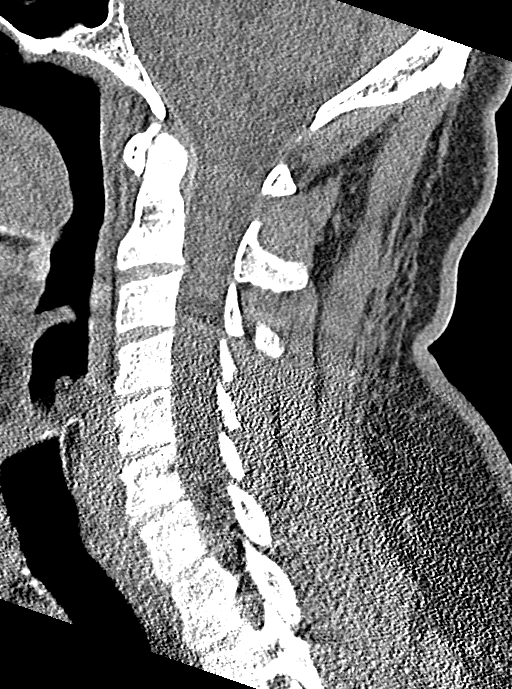
[im 33/66  bone]
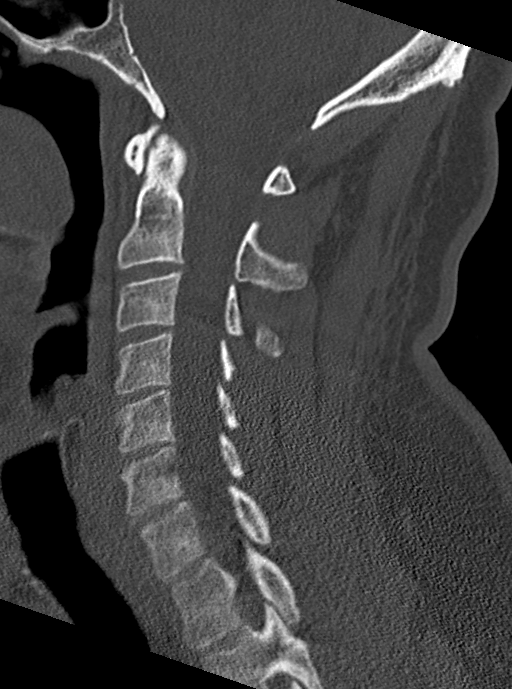
[im 38/66  bone]
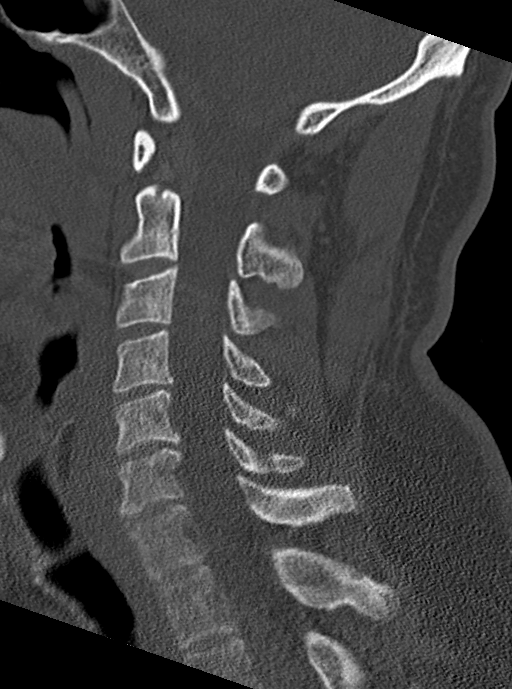
[im 44/66  bone]
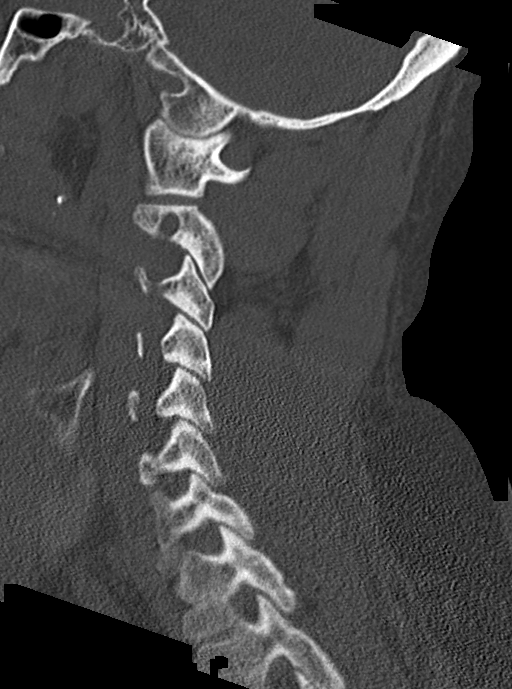

[Series 5: coronal bone · coronal · 0.29mm/px · 3 of 61 slices shown]
[im 14/61  bone]
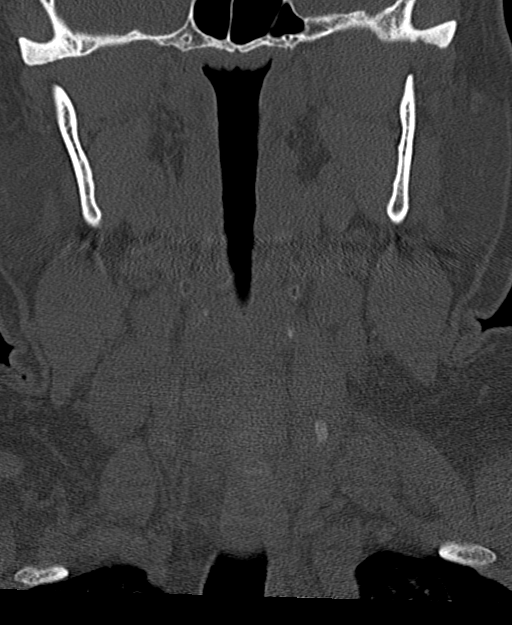
[im 25/61  bone]
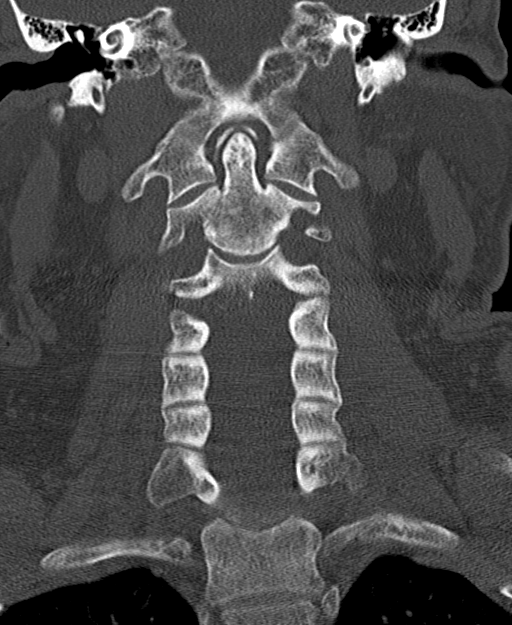
[im 36/61  bone]
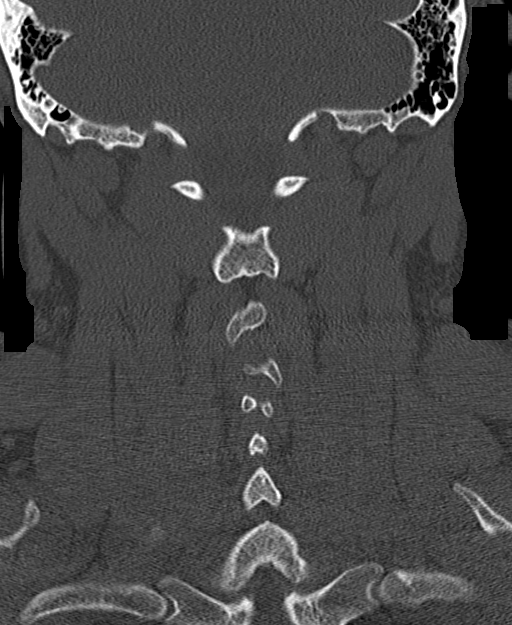

[Series 6: orthogonal bone · axial · 0.26mm/px · z∈[-247,-136]mm · 5 of 92 slices shown, 7 images]
[im 16/92  soft-tissue]
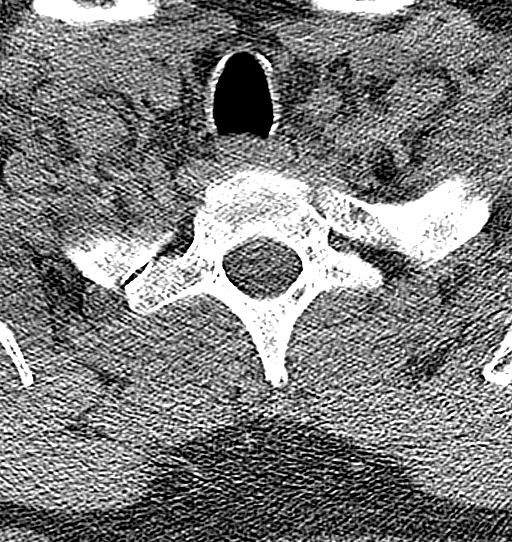
[im 16/92  bone]
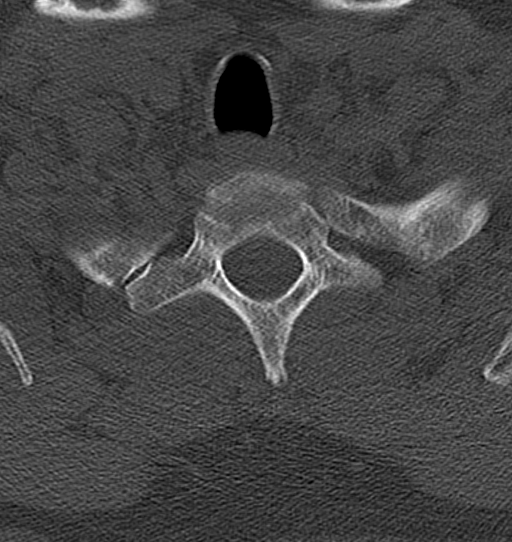
[im 31/92  bone]
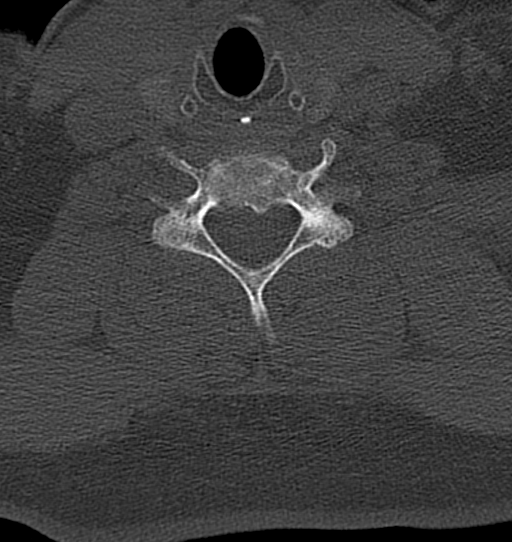
[im 46/92  bone]
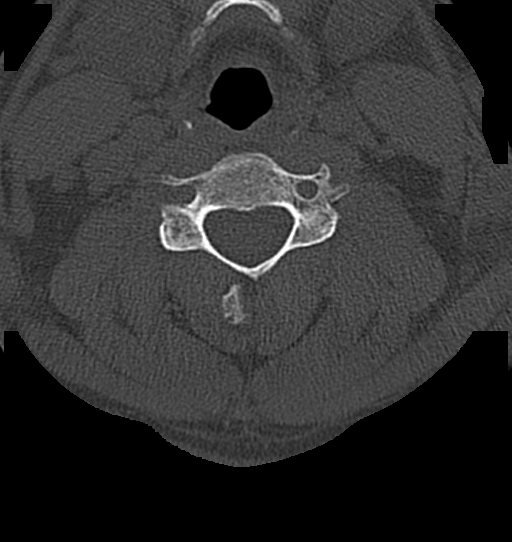
[im 61/92  bone]
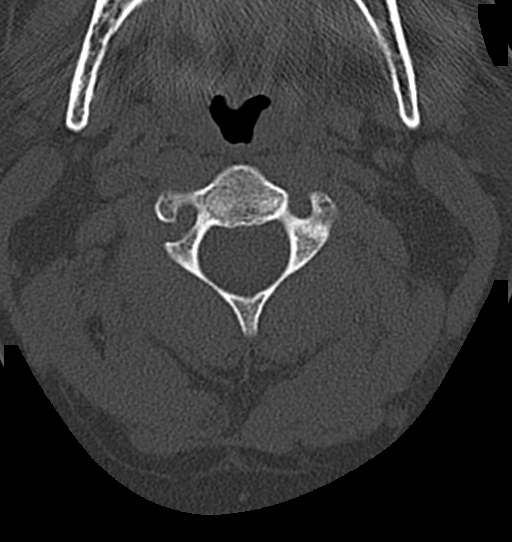
[im 76/92  soft-tissue]
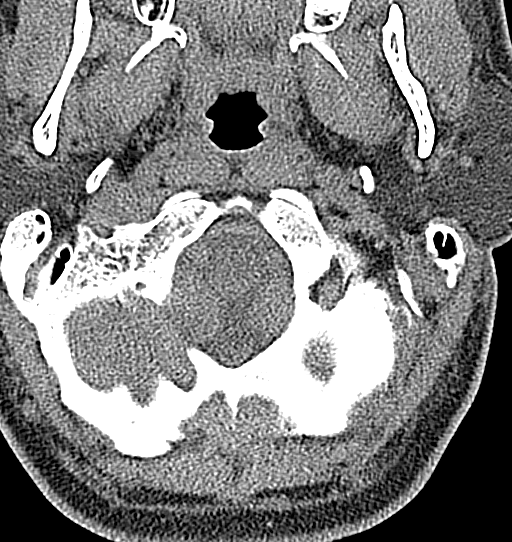
[im 76/92  bone]
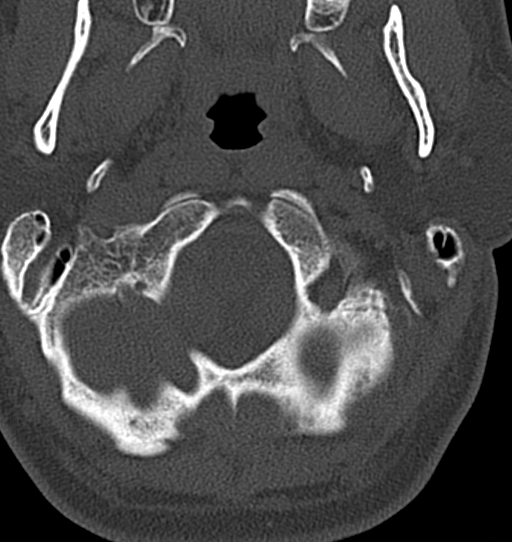

[13 of 33 positions shown; findings below may reference images not displayed]

FINDINGS: CT HEAD FINDINGS

Brain: No evidence of acute infarction, hemorrhage, hydrocephalus,
extra-axial collection or mass lesion/mass effect.

Vascular: No hyperdense vessel or unexpected calcification.

Skull: Normal. Negative for fracture or focal lesion.

Sinuses/Orbits: Visualized globes and orbits are unremarkable.
Visualized sinuses are clear.

Other: None.

CT CERVICAL SPINE FINDINGS

Alignment: Normal.

Skull base and vertebrae: No acute fracture. No primary bone lesion
or focal pathologic process.

Soft tissues and spinal canal: No prevertebral fluid or swelling. No
visible canal hematoma.

Disc levels: Mild loss of disc height at C4-C5. Minor loss of disc
height at C5-C6 and C6-C7. No significant disc bulging. No evidence
of a disc herniation. No stenosis.

Upper chest: Negative.

Other: None.
IMPRESSION: HEAD CT

1. Normal.

CERVICAL CT

1. No fracture or acute finding.

## 2021-08-09 MED ORDER — POTASSIUM CHLORIDE CRYS ER 20 MEQ PO TBCR
40.0000 meq | EXTENDED_RELEASE_TABLET | Freq: Once | ORAL | Status: AC
  Administered 2021-08-09: 40 meq via ORAL
  Filled 2021-08-09: qty 2

## 2021-08-09 MED ORDER — SODIUM CHLORIDE 0.9 % IV BOLUS
500.0000 mL | Freq: Once | INTRAVENOUS | Status: AC
  Administered 2021-08-09: 500 mL via INTRAVENOUS

## 2021-08-09 MED ORDER — ACETAMINOPHEN 650 MG RE SUPP: 650.0000 mg | RECTAL | Status: DC | PRN

## 2021-08-09 MED ORDER — ONDANSETRON HCL 4 MG PO TABS: 4.0000 mg | ORAL_TABLET | Freq: Four times a day (QID) | ORAL | Status: DC | PRN

## 2021-08-09 MED ORDER — ONDANSETRON HCL 4 MG/2ML IJ SOLN: 4.0000 mg | Freq: Four times a day (QID) | INTRAMUSCULAR | Status: DC | PRN

## 2021-08-09 MED ORDER — CHLORHEXIDINE GLUCONATE 0.12% ORAL RINSE (MEDLINE KIT)
15.0000 mL | Freq: Two times a day (BID) | OROMUCOSAL | Status: DC
  Filled 2021-08-09 (×3): qty 15

## 2021-08-09 MED ORDER — ACETAMINOPHEN 325 MG PO TABS: 650.0000 mg | ORAL_TABLET | ORAL | Status: DC | PRN

## 2021-08-09 MED ORDER — ONDANSETRON HCL 4 MG/2ML IJ SOLN
4.0000 mg | INTRAMUSCULAR | Status: AC
Start: 2021-08-09 — End: 2021-08-09
  Administered 2021-08-09: 4 mg via INTRAVENOUS
  Filled 2021-08-09: qty 2

## 2021-08-09 MED ORDER — LORAZEPAM 2 MG/ML IJ SOLN: 4.0000 mg | INTRAMUSCULAR | Status: DC | PRN

## 2021-08-09 MED ORDER — ORAL CARE MOUTH RINSE
15.0000 mL | OROMUCOSAL | Status: DC
  Administered 2021-08-10: 15 mL via OROMUCOSAL
  Filled 2021-08-09 (×12): qty 15

## 2021-08-09 NOTE — ED Notes (Signed)
Pt. Is currently having dinner, will attempt on getting orthostatic vitals once pt. Is finish eating.  ?

## 2021-08-09 NOTE — ED Notes (Signed)
ED provider at bedside examining pt. ?

## 2021-08-09 NOTE — ED Notes (Signed)
Pt resting with eyes closed and lights dimmed. Wife and other family member at bedside. ?

## 2021-08-09 NOTE — H&P (Addendum)
?History and Physical  ? ? ?Patient: John Galvan VEH:[id number] DOB: [birth year] ?DOA: 08/09/2021 ?DOS: the patient was seen and examined on 08/09/2021 ?PCP: Pcp, No  ?Patient coming from: Home ? ?Chief Complaint:  ?Chief Complaint  ?Patient presents with  ? Seizures  ? ? ?HPI: John Galvan is a 56 y.o. male with medical history significant for dyslipidemia and hypertension who presents to the ER via EMS for evaluation after he fell in a store while shopping with his wife. ?Patient states that he was in a shoe store and looked up and suddenly felt very dizzy and lightheaded.  He felt he was losing his balance and tried to steady himself but fell.  He denied having any tinnitus, no nausea or any symptoms prior to this.  He does not remember what happened after he fell. ?He denied having a headache prior to this and woke up in his usual state of health. ?Patient's wife states that she was at the door when she saw him for landing on a display rack and he started having jerking movements involving his hands and feet.  She notes that he bit his tongue but is unsure if he had any urinary or fecal incontinence.  She states that his symptoms lasted for couple of minutes and when he stopped having the jerking movements he was confused and agitated (he was unaware of his surroundings).  EMS arrived and transported him to the ER. ?Patient states that he was nauseous and lightheaded in route to the hospital and had an episode of emesis. ?Per EMS CBG was 74. ?During my evaluation patient is awake, alert and oriented to person place and time. ?Has never had an episode like this in the past. ? ?Review of Systems: As mentioned in the history of present illness. All other systems reviewed and are negative. ?Past Medical History:  ?Diagnosis Date  ? Hyperlipidemia   ? Hypertension   ? ?History reviewed. No pertinent surgical history. ?Social History:  reports that he has never smoked. He has never used smokeless tobacco. He reports that  he does not drink alcohol and does not use drugs. ? ?No Known Allergies ? ?History reviewed. No pertinent family history. ? ?Prior to Admission medications   ?Not on File  ? ? ?Physical Exam: ?Vitals:  ? 08/09/21 1338 08/09/21 1342 08/09/21 1420  ?BP: 116/77  125/70  ?Pulse: 75  70  ?Resp: 15  16  ?Temp: (!) 97.5 ?F (36.4 ?C)    ?TempSrc: Oral    ?SpO2: 96%  96%  ?Weight:  77.1 kg   ?Height:  5\' 5"  (1.651 m)   ? ?Physical Exam ?Vitals and nursing note reviewed.  ?Constitutional:   ?   Appearance: Normal appearance. He is normal weight.  ?HENT:  ?   Head: Normocephalic and atraumatic.  ?   Nose: Nose normal.  ?   Mouth/Throat:  ?   Mouth: Mucous membranes are moist.  ?Eyes:  ?   Conjunctiva/sclera: Conjunctivae normal.  ?   Pupils: Pupils are equal, round, and reactive to light.  ?Cardiovascular:  ?   Rate and Rhythm: Normal rate and regular rhythm.  ?Pulmonary:  ?   Effort: Pulmonary effort is normal.  ?   Breath sounds: Normal breath sounds.  ?Abdominal:  ?   General: Abdomen is flat. Bowel sounds are normal.  ?   Palpations: Abdomen is soft.  ?Musculoskeletal:     ?   General: Normal range of motion.  ?   Cervical back: Normal  range of motion.  ?Skin: ?   General: Skin is warm and dry.  ?Neurological:  ?   General: No focal deficit present.  ?   Mental Status: He is alert and oriented to person, place, and time.  ?Psychiatric:     ?   Mood and Affect: Mood normal.     ?   Behavior: Behavior normal.  ? ? ? ?Data Reviewed: ?Relevant notes from primary care and specialist visits, past discharge summaries as available in EHR, including Care Everywhere. ?Prior diagnostic testing as pertinent to current admission diagnoses ?Updated medications and problem lists for reconciliation ?ED course, including vitals, labs, imaging, treatment and response to treatment ?Triage notes, nursing and pharmacy notes and ED provider's notes ?Notable results as noted in HPI ?Labs reviewed.  Potassium 3.4, bicarb 21, glucose 121 ?CT scan  of head and cervical spine showed no acute finding ?Twelve-lead EKG reviewed by me shows normal sinus rhythm ?There are no new results to review at this time. ? ?Assessment and Plan: ?* Seizure (HCC) ?Rule out convulsive syncope versus new onset seizure ?Place patient on seizure precautions ?Ativan 2 mg IV as needed seizure episode ?Neuro checks every 4 hours ?We will obtain EEG as well as an MRI of the brain without contrast ?Place patient on cardiac monitor to rule out arrhythmias as a cause of his ??  Syncope ?Obtain 2D echocardiogram ?Consult neurology ? ? ? ? ? ? ? ?Advance Care Planning:   Code Status: Full Code  ? ?Consults: Neurology ? ?Family Communication: Greater than 50% of time was spent discussing patient's condition and plan of care with him and his wife at the bedside.  All questions and concerns have been addressed.  They verbalize understanding and agree with the plan. ? ?Severity of Illness: ?The appropriate patient status for this patient is OBSERVATION. Observation status is judged to be reasonable and necessary in order to provide the required intensity of service to ensure the patient's safety. The patient's presenting symptoms, physical exam findings, and initial radiographic and laboratory data in the context of their medical condition is felt to place them at decreased risk for further clinical deterioration. Furthermore, it is anticipated that the patient will be medically stable for discharge from the hospital within 2 midnights of admission.  ? ?Author: ?Lucile Shutters, MD ?08/09/2021 4:21 PM ? ?For on call review [email or web address].  ?

## 2021-08-09 NOTE — Assessment & Plan Note (Addendum)
Suspect first episode of seizure, less likely convulsive syncope. No dysrhythmias detected as of yet on telemetry monitoring.  ?- Continue seizure precautions, holding AED pending neurology recommendations.  ?- Echo pending ?- EEG pending. ?- Driving restrictions discussed with patient. ?- Recommend getting enough rest, staying hydrated, etc.  ?

## 2021-08-09 NOTE — ED Provider Notes (Signed)
? ?[facility] ?Provider Note ? ? ? Event Date/Time  ? First MD Initiated Contact with Patient 08/09/21 1358   ?  (approximate) ? ? ?History  ? ?Seizures ? ? ?HPI ? ?John Galvan is a 56 y.o. male history of hypercholesterolemia ? ?Is been in his normal state of health.  He was out shopping with his wife started feeling slightly lightheaded while looking up, then wife reports he had episode where he was shaking convulsing on the floor frothing at the mouth but his tongue did not recognize her for several minutes.  He was somewhat agitated and combative resisting people trying to assist him for for several minutes ? ?Wife reports that look like he had a seizure ? ?He has no history of seizure disorder.  He does not smoke he is not use alcohol. ? ?He has been healthy otherwise ? ?Patient reports a feeling of lightheadedness at this time.  Denies any headache no chest pain trouble breathing.  Reports the right side of his neck feels slightly sore.  No numbness or weakness.  No trouble speaking.  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 ? ?  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 ?Physical Exam  ? ?Triage Vital Signs: ?ED Triage Vitals  ?Enc Vitals Group  ?   BP 08/09/21 1338 116/77  ?   Pulse Rate 08/09/21 1338 75  ?   Resp 08/09/21 1338 15  ?   Temp 08/09/21 1338 (!) 97.5 ?F (36.4 ?C)  ?   Temp Source 08/09/21 1338 Oral  ?   SpO2 08/09/21 1338 96 %  ?   Weight 08/09/21 1342 170 lb (77.1 kg)  ?   Height 08/09/21 1342 5\' 5"  (1.651 m)  ?   Head Circumference --   ?   Peak Flow --   ?   Pain Score 08/09/21 1341 0  ?   Pain Loc --   ?   Pain Edu? --   ?   Excl. in GC? --   ? ? ?Most recent vital signs: ?Vitals:  ? 08/09/21 1338 08/09/21 1420  ?BP: 116/77 125/70  ?Pulse: 75 70  ?Resp: 15 16  ?Temp: (!) 97.5 ?F (36.4 ?C)   ?SpO2: 96% 96%  ? ? ? ?General: Awake, no distress.  Appears  slightly fatigued to slightly somnolent, but easily alerts to voice follows commands and participates ?Extraocular movements are normal.  Pupils equal round reactive to light ?Small laceration with bleeding controlled over the approximately 6 o'clock position of his tongue on its right side ?CV:  Good peripheral perfusion.  Normal heart tones ?Resp:  Normal effort.  Clear lung sounds bilateral ?Abd:  No distention.  ?Other:  Cranial nerve exam normal.  Moves all extremities to command without difficulty.  Speech is clear.  Thought content is normal at this time recognize his wife and knows of what story he was at out shopping when this happened ? ? ?ED Results / Procedures / Treatments  ? ?Labs ?(all labs ordered are listed, but only abnormal results are displayed) ?Labs Reviewed  ?COMPREHENSIVE METABOLIC PANEL - Abnormal; Notable for the following components:  ?    Result Value  ? Potassium 3.4 (*)   ? CO2 21 (*)   ? Glucose, Bld 121 (*)   ? All other components within normal limits  ?CBG MONITORING, ED - Abnormal; Notable for the following  components:  ? Glucose-Capillary 112 (*)   ? All other components within normal limits  ?RESP PANEL BY RT-PCR (FLU A&B, COVID) ARPGX2  ?CBC WITH DIFFERENTIAL/PLATELET  ? ? ? ?EKG ? ?Reviewed enterotomy at 1348 ?Heart rate 70 ?QRS 100 ?QTc 440 ?Normal sinus rhythm no evidence of acute ischemia or ectopy ? ? ?RADIOLOGY ?CT head and cervical spine imaging negative for acute finding ? ? ? ?PROCEDURES: ? ?Critical Care performed: No ? ?Procedures ? ? ?MEDICATIONS ORDERED IN ED: ?Medications  ?sodium chloride 0.9 % bolus 500 mL (0 mLs Intravenous Stopped 08/09/21 1514)  ?ondansetron (ZOFRAN) injection 4 mg (4 mg Intravenous Given 08/09/21 1515)  ? ? ? ?IMPRESSION / MDM / ASSESSMENT AND PLAN / ED COURSE  ?I reviewed the triage vital signs and the nursing notes. ?             ?               ? ?Differential diagnosis includes, but is not limited to, first-time seizure versus syncopal type  episode.  No preceding palpitations, no clinical symptoms that would be highly suggestive of ACS.  Denies chest pain.  No significant past medical history other than hypercholesterolemia, no noted risk factors for pulmonary embolism as cause.  He is not complaining of any chest pain or shortness of breath.  There is no hypoxia or tachycardia. ? ?Clinical history of biting tongue foaming at mouth seems concerning for possible first-time seizure.  CT head and cervical spine ordered.  Electrolyte panel pending ? ?Labs reviewed including CBC which is normal.  Normal glucose ? ? ?The patient is on the cardiac monitor to evaluate for evidence of arrhythmia and/or significant heart rate changes. ? ?Clinical Course as of 08/09/21 1545  ?Wynelle Link Aug 09, 2021  ?1417 I spoke with Dr. Selina Cooley of neurology regarding the case, she advises she will come and see the patient in consult today at the ER. [MQ]  ?1441 I personally viewed and interpreted the patient's CT scan of the head, my interpretation is negative for acute gross pathology such as major hemorrhage.  Defer and await final radiologist read [MQ]  ?  ?Clinical Course User Index ?[MQ] Sharyn Creamer, MD  ? ?Dr. Selina Cooley seeing patient at this time.  Advises recommendation to admit for first-time seizure versus syncope work-up.  Patient and his wife understand agreeable with this plan.  Patient currently resting without distress discussing with Dr. Selina Cooley ? ?Admission discussed with Dr. Joylene Igo placed a consult after consultation and discussing the case decision with hospitalist this patient will be admitted for concerns of increased risk of morbidity mortality associated with possible first-time seizure episode versus syncope ? ?FINAL CLINICAL IMPRESSION(S) / ED DIAGNOSES  ? ?Final diagnoses:  ?Seizure-like activity (HCC)  ? ? ? ?Rx / DC Orders  ? ?ED Discharge Orders   ? ? None  ? ?  ? ? ? ?Note:  This document was prepared using Dragon voice recognition software and may include  unintentional dictation errors. ?  Sharyn Creamer, MD ?08/09/21 1545 ? ?

## 2021-08-09 NOTE — ED Notes (Signed)
CBG 112.  

## 2021-08-09 NOTE — ED Notes (Signed)
Neurologist at bedside. 

## 2021-08-09 NOTE — ED Triage Notes (Signed)
56 [place] ?[place]ed seizure, people heard him fall, then was on ground having tonic clonic seizures, witnessed, foaming at mouth ?No history of seizures, does have hx HTN ?Pupils were constricted but equal ?Pt became nauseous and lightheaded en route, 18g L AC received 4mg  zofran IV ?Pt was vomiting on way to ED, CBG was 74, other VS were normal, no other seizures onw ay to ED ?Eyes closed, diaphoretic at this time ?Pt complains of HA ?

## 2021-08-09 NOTE — Progress Notes (Signed)
Neurology brief note ? ?56 yo gentleman with no past hx seizure presents after event while shopping during which he became LH, fell to the floor, convulsed for several minutes and bit his tongue, after which he was combative then lethargic. Event c/f seizure, less likely convulsive syncope. Neurologic exam normal. Head CT NAICP. ? ?Recommendations: ?- Admit obs ?- MRI brain with and without contrast  ?- rEEG ?- No indication for AEDs at this time ?- CTA or MRA H&N ?- TTE ?- Tele ? ?Full consult note to follow. ? ?Bing Neighbors, MD ?[facility] ?[phone] ? ?If 7pm- 7am, please page neurology on call as listed in [facility]ION. ? ?

## 2021-08-10 ENCOUNTER — Observation Stay: Payer: Managed Care, Other (non HMO)

## 2021-08-10 ENCOUNTER — Observation Stay (HOSPITAL_BASED_OUTPATIENT_CLINIC_OR_DEPARTMENT_OTHER)
Admit: 2021-08-10 | Discharge: 2021-08-10 | Disposition: A | Discharge status: Home / Self Care | Payer: Managed Care, Other (non HMO) | Attending: Internal Medicine | Admitting: Internal Medicine

## 2021-08-10 DIAGNOSIS — R42 Dizziness and giddiness: Secondary | ICD-10-CM | POA: Diagnosis not present

## 2021-08-10 DIAGNOSIS — R569 Unspecified convulsions: Secondary | ICD-10-CM | POA: Diagnosis not present

## 2021-08-10 DIAGNOSIS — R55 Syncope and collapse: Secondary | ICD-10-CM | POA: Diagnosis not present

## 2021-08-10 DIAGNOSIS — W19XXXA Unspecified fall, initial encounter: Secondary | ICD-10-CM | POA: Diagnosis not present

## 2021-08-10 LAB — BASIC METABOLIC PANEL
Anion gap: 6 (ref 5–15)
BUN: 14 mg/dL (ref 6–20)
CO2: 25 mmol/L (ref 22–32)
Calcium: 8.8 mg/dL — ABNORMAL LOW (ref 8.9–10.3)
Chloride: 108 mmol/L (ref 98–111)
Creatinine, Ser: 1.07 mg/dL (ref 0.61–1.24)
GFR, Estimated: >60 mL/min (ref >60)
Glucose, Bld: 100 mg/dL — ABNORMAL HIGH (ref 70–99)
Potassium: 3.9 mmol/L (ref 3.5–5.1)
Sodium: 139 mmol/L (ref 135–145)

## 2021-08-10 LAB — ECHOCARDIOGRAM COMPLETE
AR max vel: 2.48 cm2
AV Area VTI: 2.6 cm2
AV Area mean vel: 2.34 cm2
AV Mean grad: 3 mmHg
AV Peak grad: 4.8 mmHg
Ao pk vel: 1.1 m/s
Area-P 1/2: 2.85 cm2
Height: 65 in
MV VTI: 2.17 cm2
S' Lateral: 2.34 cm
Weight: 2720 oz

## 2021-08-10 LAB — CBC
HCT: 38.7 % — ABNORMAL LOW (ref 39.0–52.0)
Hemoglobin: 13.3 g/dL (ref 13.0–17.0)
MCH: 30.1 pg (ref 26.0–34.0)
MCHC: 34.4 g/dL (ref 30.0–36.0)
MCV: 87.6 fL (ref 80.0–100.0)
Platelets: 236 10*3/uL (ref 150–400)
RBC: 4.42 MIL/uL (ref 4.22–5.81)
RDW: 13 % (ref 11.5–15.5)
WBC: 7.6 10*3/uL (ref 4.0–10.5)
nRBC: 0 % (ref 0.0–0.2)

## 2021-08-10 LAB — HIV ANTIBODY (ROUTINE TESTING W REFLEX): HIV Screen 4th Generation wRfx: NONREACTIVE

## 2021-08-10 IMAGING — CT CT ANGIO HEAD-NECK (W OR W/O PERF)
1 of 11 series · 5 of 33 positions shown · non-contrast
Comparison: Brain MRI [DATE] and this morning. Head CT
[DATE].

CLINICAL DATA: 56-year-old male with syncope,  seizure activity.

EXAM:
CT ANGIOGRAPHY HEAD AND NECK
TECHNIQUE: Multidetector CT imaging of the head and neck was performed using
the standard protocol during bolus administration of intravenous
contrast. Multiplanar CT image reconstructions and MIPs were
obtained to evaluate the vascular anatomy. Carotid stenosis
measurements (when applicable) are obtained utilizing NASCET
criteria, using the distal internal carotid diameter as the
denominator.

[Series 10: ax thin · axial · 0.56mm/px · z∈[-266,-23]mm · 5 of 373 slices shown]
[im 63/373  soft-tissue]
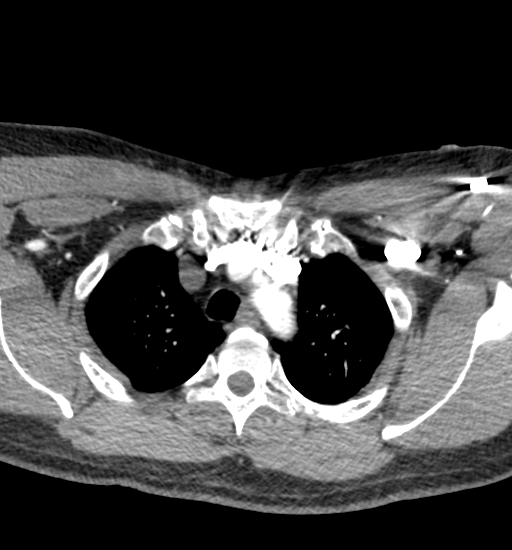
[im 125/373  bone]
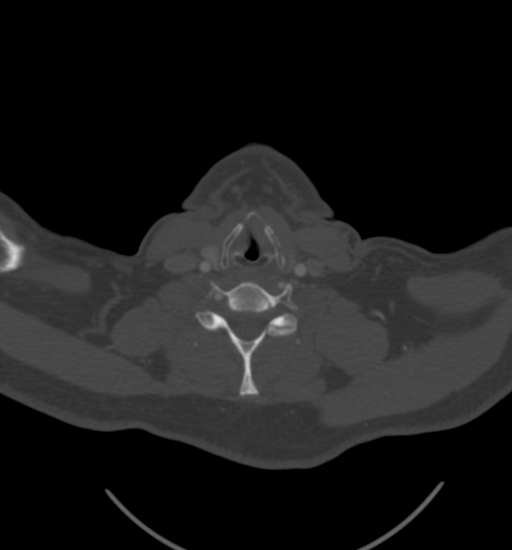
[im 187/373  soft-tissue]
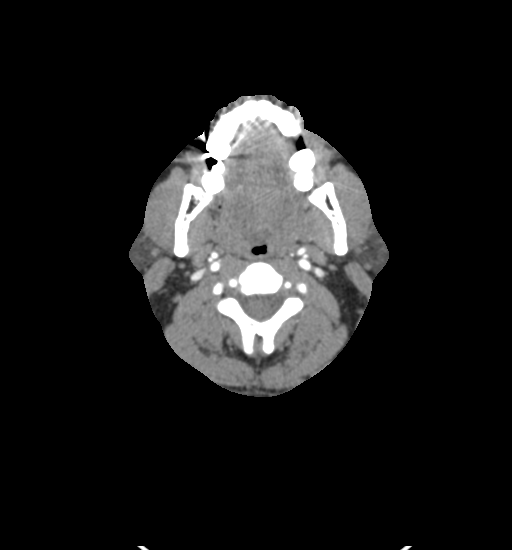
[im 249/373  bone]
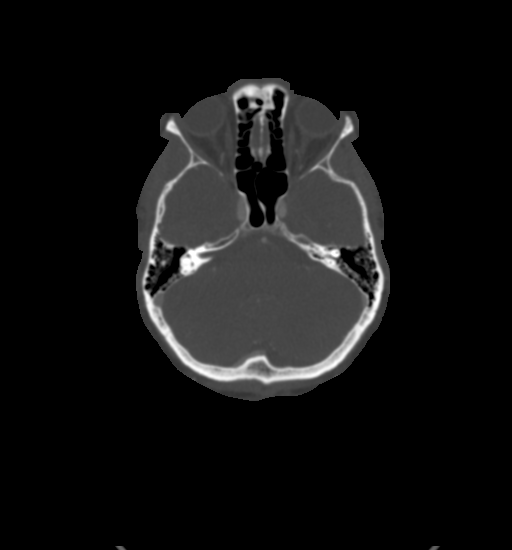
[im 311/373  soft-tissue]
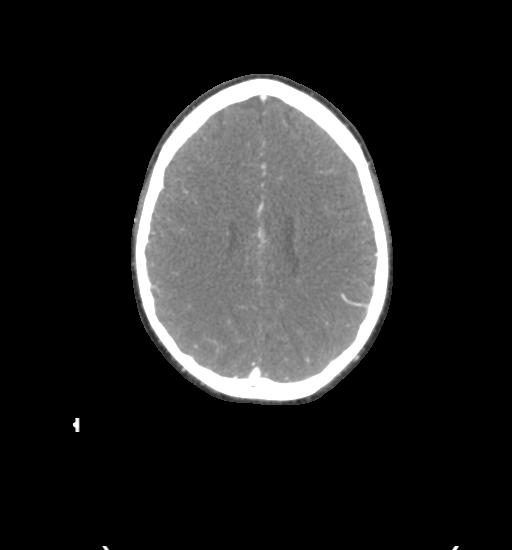

[5 of 33 positions shown; findings below may reference images not displayed]

RADIATION DOSE REDUCTION: This exam was performed according to the
departmental dose-optimization program which includes automated
exposure control, adjustment of the mA and/or kV according to
patient size and/or use of iterative reconstruction technique.

CONTRAST:  75mL OMNIPAQUE IOHEXOL 350 MG/ML SOLN
FINDINGS: CT HEAD

Brain: Stable non contrast CT appearance of the brain. Left
lentiform perivascular space.

Calvarium and skull base: Negative.

Paranasal sinuses: Visualized paranasal sinuses and mastoids are
stable and well aerated.

Orbits: Visualized orbits and scalp soft tissues are within normal
limits.

CTA NECK

Skeleton: Negative.

Upper chest: Minor upper lung atelectasis.

Other neck: Mild motion artifact at the hypopharynx. Otherwise
negative.

Aortic arch: 4 vessel arch configuration, left vertebral artery
arises directly from the arch. No arch atherosclerosis.

Right carotid system: Negative.

Left carotid system: Negative.

Vertebral arteries:
Negative; the right vertebral artery is dominant and the left arises
directly from the aortic arch.

CTA HEAD

Posterior circulation: Negative posterior vertebral arteries and
vertebrobasilar junction, mildly dominant right V4. Patent PICA
origins. Patent basilar artery without stenosis. Normal SCA and PCA
origins. Posterior communicating arteries are diminutive or absent.
Bilateral PCA branches are within normal limits.

Anterior circulation: Right ICA siphons are patent and normal.
Normal ophthalmic artery origins. Patent and normal carotid termini,
MCA and ACA origins. Normal anterior communicating artery. Bilateral
ACA branches are within normal limits.

Right MCA M1 segment, bifurcation, and right MCA branches are within
normal limits. The left MCA bifurcates directly off the ICA terminus
(series 14, image 24, "duplicated" M1 appearance). Left MCA branches
are within normal limits.

Venous sinuses: Patent.

Anatomic variants: Non dominant left vertebral artery arises
directly from the aortic arch. Left MCA bifurcation directly from
the left ICA terminus.

Review of the MIP images confirms the above findings
IMPRESSION: 1. Negative CTA head and neck. Several incidental normal anatomic
variations.
2. Stable and negative noncontrast CT appearance of the brain.

## 2021-08-10 IMAGING — MR MR HEAD W/ CM
2 series · 48 of 48 positions shown · IV contrast (7ml Gadavist)
Comparison: MRI without contrast [DATE].

CLINICAL DATA: 56-year-old male with seizure activity. Questionable
increased hippocampal T2 hyperintensity on noncontrast MRI
yesterday.

EXAM:
MRI HEAD WITH CONTRAST
TECHNIQUE: Multiplanar, multiecho pulse sequences of the brain and surrounding
structures were obtained with intravenous contrast.
CONTRAST:  7mL GADAVIST GADOBUTROL 1 MMOL/ML IV SOLN

[Series 5: T1 post-contrast · axial · 1.0mm · 0.98mm/px · z∈[-114,+57]mm · 41 of 176 slices shown (1 of 2)]
[im 1/176]
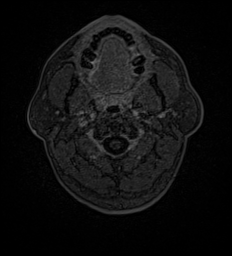
[im 5/176]
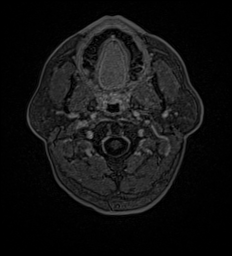
[im 9/176]
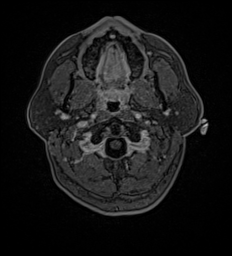
[im 14/176]
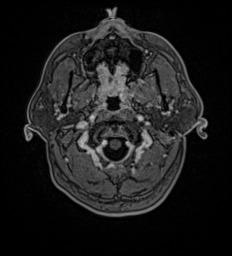
[im 18/176]
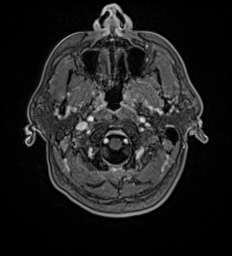
[im 22/176]
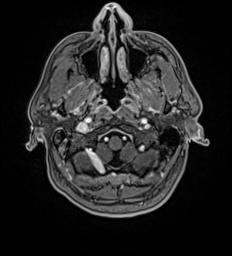
[im 27/176]
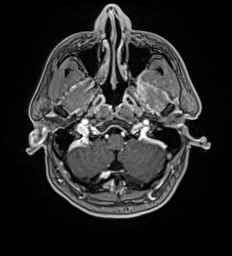
[im 31/176]
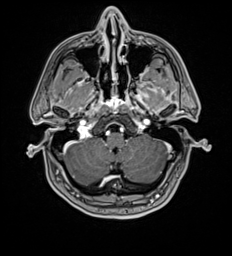
[im 36/176]
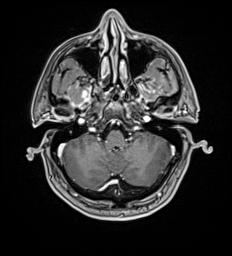
[im 40/176]
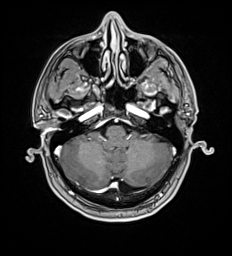
[im 44/176]
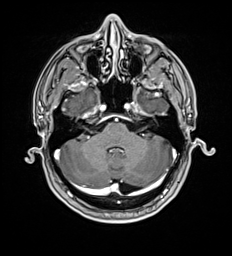
[im 49/176]
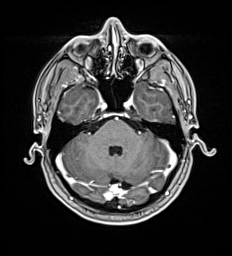
[im 53/176]
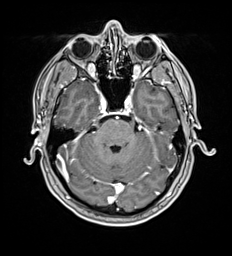
[im 57/176]
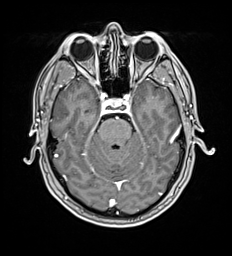
[im 62/176]
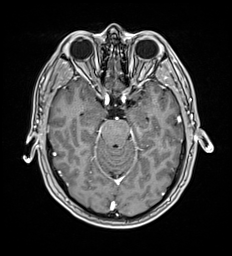
[im 66/176]
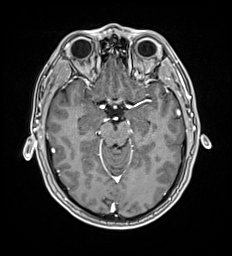
[im 71/176]
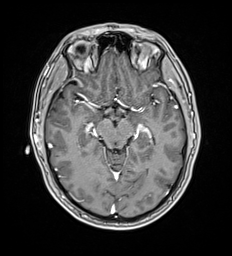
[im 75/176]
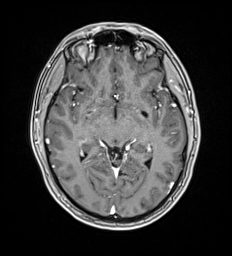
[im 79/176]
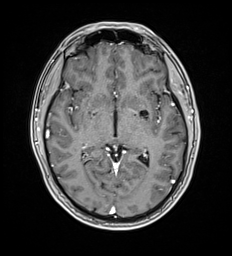
[im 84/176]
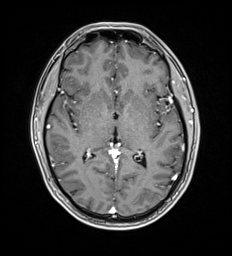
[im 88/176]
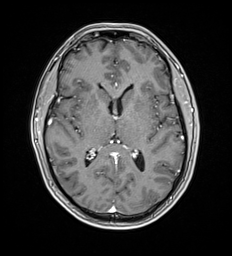
[im 92/176]
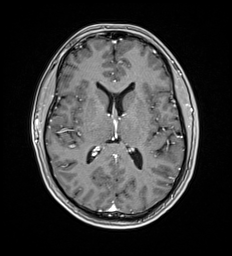
[im 97/176]
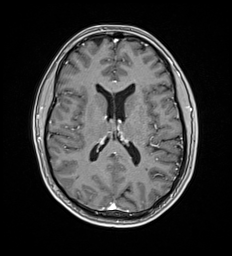
[im 101/176]
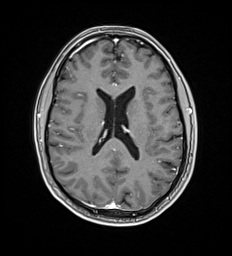
[im 106/176]
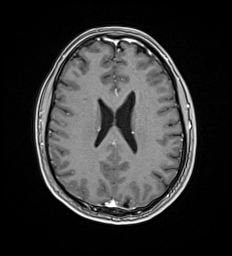
[im 110/176]
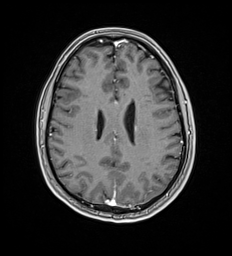
[im 114/176]
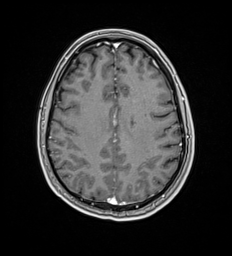
[im 119/176]
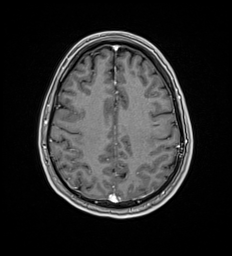
[im 123/176]
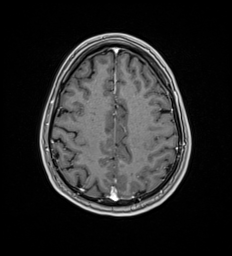
[im 127/176]
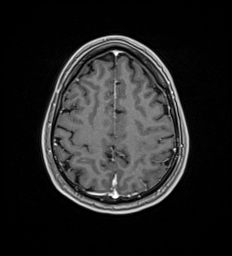
[im 132/176]
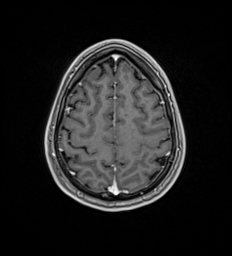
[im 136/176]
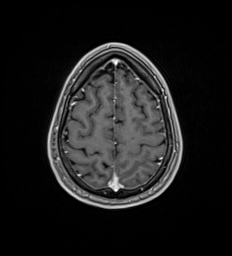
[im 141/176]
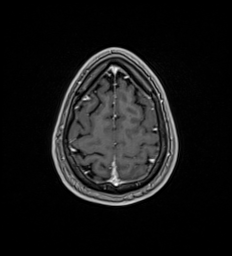
[im 145/176]
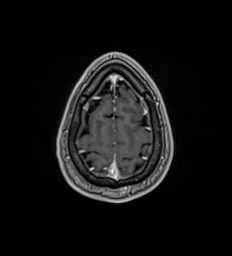
[im 149/176]
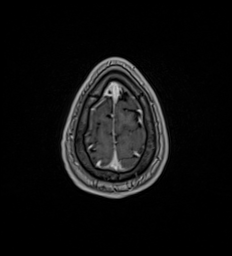
[im 154/176]
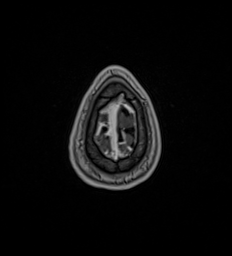
[im 158/176]
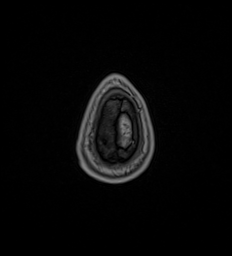
[im 162/176]
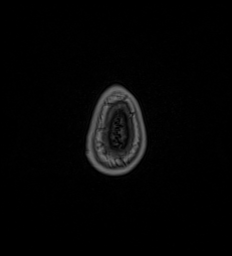
[im 167/176]
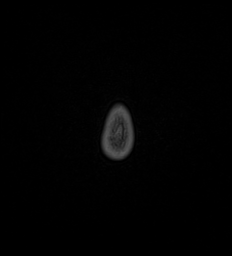
[im 171/176]
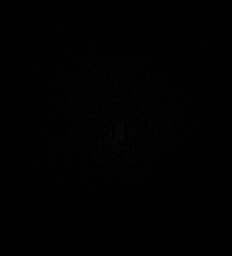
[im 176/176]
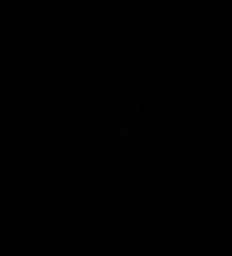

[Series 6: T1 post-contrast · coronal · 5.0mm · 0.57mm/px · 7 of 30 slices shown (2 of 2)]
[im 1/30]
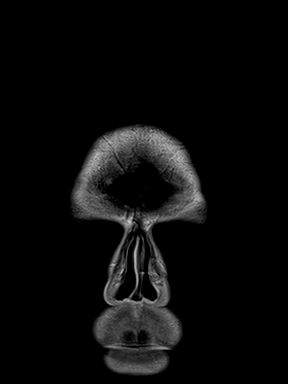
[im 5/30]
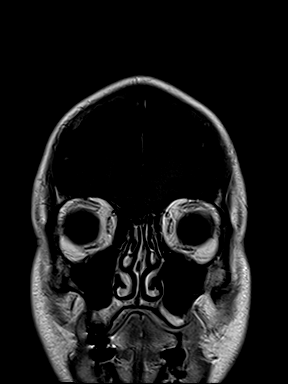
[im 10/30]
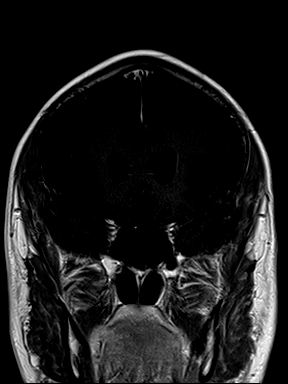
[im 15/30]
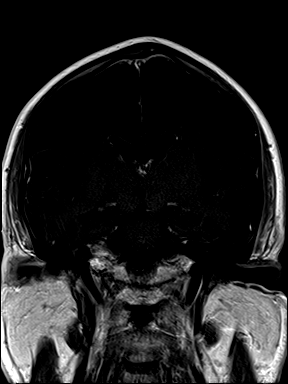
[im 20/30]
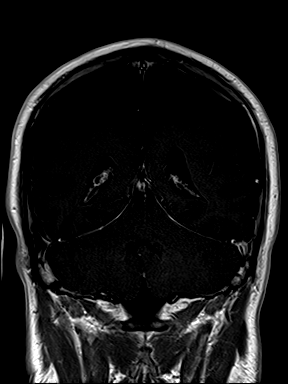
[im 25/30]
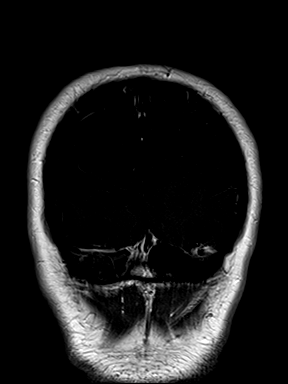
[im 30/30]
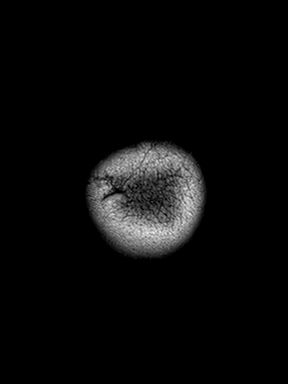

[48 of 48 positions shown; findings below may reference images not displayed]

FINDINGS: Post-contrast axial and coronal T1 weighted imaging.

Major dural venous sinuses are enhancing and appear to be patent. No
midline shift, mass effect, or evidence of intracranial mass lesion.
No ventriculomegaly. Stable visible scalp, face, orbits.

No abnormal enhancement identified. No dural thickening. No
hippocampal enhancement.
IMPRESSION: Negative postcontrast MRI Brain.

## 2021-08-10 MED ORDER — IOHEXOL 350 MG/ML SOLN
75.0000 mL | Freq: Once | INTRAVENOUS | Status: AC | PRN
  Administered 2021-08-10: 75 mL via INTRAVENOUS
  Filled 2021-08-10: qty 75

## 2021-08-10 MED ORDER — GADOBUTROL 1 MMOL/ML IV SOLN
7.0000 mL | Freq: Once | INTRAVENOUS | Status: AC | PRN
  Administered 2021-08-10: 7 mL via INTRAVENOUS
  Filled 2021-08-10: qty 7.5

## 2021-08-10 NOTE — Progress Notes (Incomplete)
Subjective: ***  Objective: Current vital signs: BP 121/68    Pulse 70    Temp 97.9 F (36.6 C) (Oral)    Resp 18    Ht 5' 5"  (1.651 m)    Wt 77.1 kg    SpO2 96%    BMI 28.29 kg/m  Vital signs in last 24 hours: Temp:  [97.5 F (36.4 C)-98 F (36.7 C)] 97.9 F (36.6 C) (03/06 0400) Pulse Rate:  [49-75] 70 (03/06 0600) Resp:  [12-20] 18 (03/06 0600) BP: (115-125)/(64-80) 121/68 (03/06 0600) SpO2:  [95 %-100 %] 96 % (03/06 0600) Weight:  [77.1 kg] 77.1 kg (03/05 1342)  Intake/Output from previous day: 03/05 0701 - 03/06 0700 In: 500 [IV Piggyback:500] Out: -  Intake/Output this shift: No intake/output data recorded. Nutritional status:  Diet Order             Diet Heart Room service appropriate? Yes; Fluid consistency: Thin  Diet effective now                   Neurologic Exam: ***  Lab Results: Results for orders placed or performed during the hospital encounter of 08/09/21 (from the past 48 hour(s))  Comprehensive metabolic panel     Status: Abnormal   Collection Time: 08/09/21  1:43 PM  Result Value Ref Range   Sodium 138 135 - 145 mmol/L   Potassium 3.4 (L) 3.5 - 5.1 mmol/L   Chloride 105 98 - 111 mmol/L   CO2 21 (L) 22 - 32 mmol/L   Glucose, Bld 121 (H) 70 - 99 mg/dL    Comment: Glucose reference range applies only to samples taken after fasting for at least 8 hours.   BUN 16 6 - 20 mg/dL   Creatinine, Ser 1.12 0.61 - 1.24 mg/dL   Calcium 9.0 8.9 - 10.3 mg/dL   Total Protein 7.8 6.5 - 8.1 g/dL   Albumin 4.5 3.5 - 5.0 g/dL   AST 36 15 - 41 U/L   ALT 27 0 - 44 U/L   Alkaline Phosphatase 53 38 - 126 U/L   Total Bilirubin 0.7 0.3 - 1.2 mg/dL   GFR, Estimated >60 >60 mL/min    Comment: (NOTE) Calculated using the CKD-EPI Creatinine Equation (2021)    Anion gap 12 5 - 15    Comment: Performed at [facility], [address] [place], [place] [address]  CBC with Differential/Platelet     Status: None   Collection Time: 08/09/21  1:43 PM   Result Value Ref Range   WBC 6.7 4.0 - 10.5 K/uL   RBC 4.89 4.22 - 5.81 MIL/uL   Hemoglobin 14.6 13.0 - 17.0 g/dL   HCT 42.4 39.0 - 52.0 %   MCV 86.7 80.0 - 100.0 fL   MCH 29.9 26.0 - 34.0 pg   MCHC 34.4 30.0 - 36.0 g/dL   RDW 12.6 11.5 - 15.5 %   Platelets 297 150 - 400 K/uL   nRBC 0.0 0.0 - 0.2 %   Neutrophils Relative % 47 %   Neutro Abs 3.2 1.7 - 7.7 K/uL   Lymphocytes Relative 38 %   Lymphs Abs 2.6 0.7 - 4.0 K/uL   Monocytes Relative 7 %   Monocytes Absolute 0.4 0.1 - 1.0 K/uL   Eosinophils Relative 6 %   Eosinophils Absolute 0.4 0.0 - 0.5 K/uL   Basophils Relative 1 %   Basophils Absolute 0.1 0.0 - 0.1 K/uL   Immature Granulocytes 1 %   Abs  Immature Granulocytes 0.06 0.00 - 0.07 K/uL    Comment: Performed at [facility], [address] [place], [place] [address]  CBG monitoring, ED     Status: Abnormal   Collection Time: 08/09/21  1:51 PM  Result Value Ref Range   Glucose-Capillary 112 (H) 70 - 99 mg/dL    Comment: Glucose reference range applies only to samples taken after fasting for at least 8 hours.  Resp Panel by RT-PCR (Flu A&B, Covid) Nasopharyngeal Swab     Status: None   Collection Time: 08/09/21  2:13 PM   Specimen: Nasopharyngeal Swab; Nasopharyngeal(NP) swabs in vial transport medium  Result Value Ref Range   SARS Coronavirus 2 by RT PCR NEGATIVE NEGATIVE    Comment: (NOTE) SARS-CoV-2 target nucleic acids are NOT DETECTED.  The SARS-CoV-2 RNA is generally detectable in upper respiratory specimens during the acute phase of infection. The lowest concentration of SARS-CoV-2 viral copies this assay can detect is 138 copies/mL. A negative result does not preclude SARS-Cov-2 infection and should not be used as the sole basis for treatment or other patient management decisions. A negative result may occur with  improper specimen collection/handling, submission of specimen other than nasopharyngeal swab, presence of viral mutation(s) within  the areas targeted by this assay, and inadequate number of viral copies(<138 copies/mL). A negative result must be combined with clinical observations, patient history, and epidemiological information. The expected result is Negative.  Fact Sheet for Patients:  EntrepreneurPulse.com.au  Fact Sheet for Healthcare Providers:  IncredibleEmployment.be  This test is no t yet approved or cleared by the [place] FDA and  has been authorized for detection and/or diagnosis of SARS-CoV-2 by FDA under an Emergency Use Authorization (EUA). This EUA will remain  in effect (meaning this test can be used) for the duration of the COVID-19 declaration under Section 564(b)(1) of the Act, 21 U.S.C.section 360bbb-3(b)(1), unless the authorization is terminated  or revoked sooner.       Influenza A by PCR NEGATIVE NEGATIVE   Influenza B by PCR NEGATIVE NEGATIVE    Comment: (NOTE) The Xpert Xpress SARS-CoV-2/FLU/RSV plus assay is intended as an aid in the diagnosis of influenza from Nasopharyngeal swab specimens and should not be used as a sole basis for treatment. Nasal washings and aspirates are unacceptable for Xpert Xpress SARS-CoV-2/FLU/RSV testing.  Fact Sheet for Patients: EntrepreneurPulse.com.au  Fact Sheet for Healthcare Providers: IncredibleEmployment.be  This test is not yet approved or cleared by the [place] FDA and has been authorized for detection and/or diagnosis of SARS-CoV-2 by FDA under an Emergency Use Authorization (EUA). This EUA will remain in effect (meaning this test can be used) for the duration of the COVID-19 declaration under Section 564(b)(1) of the Act, 21 U.S.C. section 360bbb-3(b)(1), unless the authorization is terminated or revoked.  Performed at [facility], [address] [place], [place] [address]   HIV Antibody (routine testing w rflx)     Status: None    Collection Time: 08/09/21  4:47 PM  Result Value Ref Range   HIV Screen 4th Generation wRfx Non Reactive Non Reactive    Comment: Performed at [facility], [place] [address], [place], [place] [address]  TSH     Status: None   Collection Time: 08/09/21  4:47 PM  Result Value Ref Range   TSH 2.577 0.350 - 4.500 uIU/mL    Comment: Performed by a 3rd Generation assay with a functional sensitivity of <=0.01 uIU/mL. Performed at [facility], [address] [place], [place] [address]   Magnesium     Status: None   Collection Time: 08/09/21  4:47 PM  Result Value Ref Range   Magnesium 2.1 1.7 - 2.4 mg/dL    Comment: Performed at [facility], [address] [place], [place] [address]  CBC     Status: Abnormal   Collection Time: 08/10/21  6:31 AM  Result Value Ref Range   WBC 7.6 4.0 - 10.5 K/uL   RBC 4.42 4.22 - 5.81 MIL/uL   Hemoglobin 13.3 13.0 - 17.0 g/dL   HCT 38.7 (L) 39.0 - 52.0 %   MCV 87.6 80.0 - 100.0 fL   MCH 30.1 26.0 - 34.0 pg   MCHC 34.4 30.0 - 36.0 g/dL   RDW 13.0 11.5 - 15.5 %   Platelets 236 150 - 400 K/uL   nRBC 0.0 0.0 - 0.2 %    Comment: Performed at [facility], [address] [place], [place] [address]  Basic metabolic panel     Status: Abnormal   Collection Time: 08/10/21  6:31 AM  Result Value Ref Range   Sodium 139 135 - 145 mmol/L   Potassium 3.9 3.5 - 5.1 mmol/L   Chloride 108 98 - 111 mmol/L   CO2 25 22 - 32 mmol/L   Glucose, Bld 100 (H) 70 - 99 mg/dL    Comment: Glucose reference range applies only to samples taken after fasting for at least 8 hours.   BUN 14 6 - 20 mg/dL   Creatinine, Ser 1.07 0.61 - 1.24 mg/dL   Calcium 8.8 (L) 8.9 - 10.3 mg/dL   GFR, Estimated >60 >60 mL/min    Comment: (NOTE) Calculated using the CKD-EPI Creatinine Equation (2021)    Anion gap 6 5 - 15    Comment: Performed at [facility], [address], [place], [place] [address]    Recent Results (from the past 240 hour(s))   Resp Panel by RT-PCR (Flu A&B, Covid) Nasopharyngeal Swab     Status: None   Collection Time: 08/09/21  2:13 PM   Specimen: Nasopharyngeal Swab; Nasopharyngeal(NP) swabs in vial transport medium  Result Value Ref Range Status   SARS Coronavirus 2 by RT PCR NEGATIVE NEGATIVE Final    Comment: (NOTE) SARS-CoV-2 target nucleic acids are NOT DETECTED.  The SARS-CoV-2 RNA is generally detectable in upper respiratory specimens during the acute phase of infection. The lowest concentration of SARS-CoV-2 viral copies this assay can detect is 138 copies/mL. A negative result does not preclude SARS-Cov-2 infection and should not be used as the sole basis for treatment or other patient management decisions. A negative result may occur with  improper specimen collection/handling, submission of specimen other than nasopharyngeal swab, presence of viral mutation(s) within the areas targeted by this assay, and inadequate number of viral copies(<138 copies/mL). A negative result must be combined with clinical observations, patient history, and epidemiological information. The expected result is Negative.  Fact Sheet for Patients:  EntrepreneurPulse.com.au  Fact Sheet for Healthcare Providers:  IncredibleEmployment.be  This test is no t yet approved or cleared by the [place] FDA and  has been authorized for detection and/or diagnosis of SARS-CoV-2 by FDA under an Emergency Use Authorization (EUA). This EUA will remain  in effect (meaning this test can be used) for the duration of the COVID-19 declaration under Section 564(b)(1) of the Act, 21 U.S.C.section 360bbb-3(b)(1), unless the authorization is terminated  or revoked sooner.  Influenza A by PCR NEGATIVE NEGATIVE Final   Influenza B by PCR NEGATIVE NEGATIVE Final    Comment: (NOTE) The Xpert Xpress SARS-CoV-2/FLU/RSV plus assay is intended as an aid in the diagnosis of influenza from  Nasopharyngeal swab specimens and should not be used as a sole basis for treatment. Nasal washings and aspirates are unacceptable for Xpert Xpress SARS-CoV-2/FLU/RSV testing.  Fact Sheet for Patients: EntrepreneurPulse.com.au  Fact Sheet for Healthcare Providers: IncredibleEmployment.be  This test is not yet approved or cleared by the [place] FDA and has been authorized for detection and/or diagnosis of SARS-CoV-2 by FDA under an Emergency Use Authorization (EUA). This EUA will remain in effect (meaning this test can be used) for the duration of the COVID-19 declaration under Section 564(b)(1) of the Act, 21 U.S.C. section 360bbb-3(b)(1), unless the authorization is terminated or revoked.  Performed at [facility], [address] [place], [place] [address]     Lipid Panel No results for input(s): CHOL, TRIG, HDL, CHOLHDL, VLDL, LDLCALC in the last 72 hours.  Studies/Results: CT ANGIO HEAD NECK W WO CM  Result Date: 08/10/2021 CLINICAL DATA:  56 year old male with syncope,  seizure activity. EXAM: CT ANGIOGRAPHY HEAD AND NECK TECHNIQUE: Multidetector CT imaging of the head and neck was performed using the standard protocol during bolus administration of intravenous contrast. Multiplanar CT image reconstructions and MIPs were obtained to evaluate the vascular anatomy. Carotid stenosis measurements (when applicable) are obtained utilizing NASCET criteria, using the distal internal carotid diameter as the denominator. RADIATION DOSE REDUCTION: This exam was performed according to the departmental dose-optimization program which includes automated exposure control, adjustment of the mA and/or kV according to patient size and/or use of iterative reconstruction technique. CONTRAST:  14m OMNIPAQUE IOHEXOL 350 MG/ML SOLN COMPARISON:  Brain MRI 08/09/2021 and this morning. Head CT 08/09/2021. FINDINGS: CT HEAD Brain: Stable non contrast CT  appearance of the brain. Left lentiform perivascular space. Calvarium and skull base: Negative. Paranasal sinuses: Visualized paranasal sinuses and mastoids are stable and well aerated. Orbits: Visualized orbits and scalp soft tissues are within normal limits. CTA NECK Skeleton: Negative. Upper chest: Minor upper lung atelectasis. Other neck: Mild motion artifact at the hypopharynx. Otherwise negative. Aortic arch: 4 vessel arch configuration, left vertebral artery arises directly from the arch. No arch atherosclerosis. Right carotid system: Negative. Left carotid system: Negative. Vertebral arteries: Negative; the right vertebral artery is dominant and the left arises directly from the aortic arch. CTA HEAD Posterior circulation: Negative posterior vertebral arteries and vertebrobasilar junction, mildly dominant right V4. Patent PICA origins. Patent basilar artery without stenosis. Normal SCA and PCA origins. Posterior communicating arteries are diminutive or absent. Bilateral PCA branches are within normal limits. Anterior circulation: Right ICA siphons are patent and normal. Normal ophthalmic artery origins. Patent and normal carotid termini, MCA and ACA origins. Normal anterior communicating artery. Bilateral ACA branches are within normal limits. Right MCA M1 segment, bifurcation, and right MCA branches are within normal limits. The left MCA bifurcates directly off the ICA terminus (series 14, image 24, "duplicated" M1 appearance). Left MCA branches are within normal limits. Venous sinuses: Patent. Anatomic variants: Non dominant left vertebral artery arises directly from the aortic arch. Left MCA bifurcation directly from the left ICA terminus. Review of the MIP images confirms the above findings IMPRESSION: 1. Negative CTA head and neck. Several incidental normal anatomic variations. 2. Stable and negative noncontrast CT appearance of the brain. Electronically Signed   By: HHerminio HeadsD.  On: 08/10/2021 07:27    CT Head Wo Contrast  Result Date: 08/09/2021 CLINICAL DATA:  Seizure, new onset. EXAM: CT HEAD WITHOUT CONTRAST CT CERVICAL SPINE WITHOUT CONTRAST TECHNIQUE: Multidetector CT imaging of the head and cervical spine was performed following the standard protocol without intravenous contrast. Multiplanar CT image reconstructions of the cervical spine were also generated. RADIATION DOSE REDUCTION: This exam was performed according to the departmental dose-optimization program which includes automated exposure control, adjustment of the mA and/or kV according to patient size and/or use of iterative reconstruction technique. COMPARISON:  None. FINDINGS: CT HEAD FINDINGS Brain: No evidence of acute infarction, hemorrhage, hydrocephalus, extra-axial collection or mass lesion/mass effect. Vascular: No hyperdense vessel or unexpected calcification. Skull: Normal. Negative for fracture or focal lesion. Sinuses/Orbits: Visualized globes and orbits are unremarkable. Visualized sinuses are clear. Other: None. CT CERVICAL SPINE FINDINGS Alignment: Normal. Skull base and vertebrae: No acute fracture. No primary bone lesion or focal pathologic process. Soft tissues and spinal canal: No prevertebral fluid or swelling. No visible canal hematoma. Disc levels: Mild loss of disc height at C4-C5. Minor loss of disc height at C5-C6 and C6-C7. No significant disc bulging. No evidence of a disc herniation. No stenosis. Upper chest: Negative. Other: None. IMPRESSION: HEAD CT 1. Normal. CERVICAL CT 1. No fracture or acute finding. Electronically Signed   By: Lajean Manes M.D.   On: 08/09/2021 14:40   CT Cervical Spine Wo Contrast  Result Date: 08/09/2021 CLINICAL DATA:  Seizure, new onset. EXAM: CT HEAD WITHOUT CONTRAST CT CERVICAL SPINE WITHOUT CONTRAST TECHNIQUE: Multidetector CT imaging of the head and cervical spine was performed following the standard protocol without intravenous contrast. Multiplanar CT image reconstructions of  the cervical spine were also generated. RADIATION DOSE REDUCTION: This exam was performed according to the departmental dose-optimization program which includes automated exposure control, adjustment of the mA and/or kV according to patient size and/or use of iterative reconstruction technique. COMPARISON:  None. FINDINGS: CT HEAD FINDINGS Brain: No evidence of acute infarction, hemorrhage, hydrocephalus, extra-axial collection or mass lesion/mass effect. Vascular: No hyperdense vessel or unexpected calcification. Skull: Normal. Negative for fracture or focal lesion. Sinuses/Orbits: Visualized globes and orbits are unremarkable. Visualized sinuses are clear. Other: None. CT CERVICAL SPINE FINDINGS Alignment: Normal. Skull base and vertebrae: No acute fracture. No primary bone lesion or focal pathologic process. Soft tissues and spinal canal: No prevertebral fluid or swelling. No visible canal hematoma. Disc levels: Mild loss of disc height at C4-C5. Minor loss of disc height at C5-C6 and C6-C7. No significant disc bulging. No evidence of a disc herniation. No stenosis. Upper chest: Negative. Other: None. IMPRESSION: HEAD CT 1. Normal. CERVICAL CT 1. No fracture or acute finding. Electronically Signed   By: Lajean Manes M.D.   On: 08/09/2021 14:40   MR BRAIN WO CONTRAST  Result Date: 08/09/2021 CLINICAL DATA:  Seizure, new onset EXAM: MRI HEAD WITHOUT CONTRAST TECHNIQUE: Multiplanar, multiecho pulse sequences of the brain and surrounding structures were obtained without intravenous contrast. COMPARISON:  Prior MRI, correlation is made with CT head 10/09/2021 FINDINGS: Brain: No restricted diffusion to suggest acute or subacute infarct. No acute hemorrhage mass, mass effect, or midline shift. Dilated perivascular spaces in the bilateral basal ganglia. No hydrocephalus or extra-axial collection. The hippocampi are symmetric in size and signal, although there may be slightly increased T2 signal in the hippocampi  bilaterally. No heterotopia or cortical dysgenesis. Vascular: Normal flow voids. Skull and upper cervical spine: Normal marrow signal. Sinuses/Orbits: Negative. Other:  The mastoids are well aerated. IMPRESSION: 1. No definite seizure etiology is seen; however, possible mildly increased T2 signal in the bilateral hippocampi may be related to recent seizure. 2. No other acute intracranial process. Electronically Signed   By: Merilyn Baba M.D.   On: 08/09/2021 19:49   MR BRAIN W CONTRAST  Result Date: 08/10/2021 CLINICAL DATA:  56 year old male with seizure activity. Questionable increased hippocampal T2 hyperintensity on noncontrast MRI yesterday. EXAM: MRI HEAD WITH CONTRAST TECHNIQUE: Multiplanar, multiecho pulse sequences of the brain and surrounding structures were obtained with intravenous contrast. CONTRAST:  69m GADAVIST GADOBUTROL 1 MMOL/ML IV SOLN COMPARISON:  MRI without contrast 08/09/2021. FINDINGS: Post-contrast axial and coronal T1 weighted imaging. Major dural venous sinuses are enhancing and appear to be patent. No midline shift, mass effect, or evidence of intracranial mass lesion. No ventriculomegaly. Stable visible scalp, face, orbits. No abnormal enhancement identified. No dural thickening. No hippocampal enhancement. IMPRESSION: Negative postcontrast MRI Brain. Electronically Signed   By: HGenevie AnnM.D.   On: 08/10/2021 06:52    Medications: Scheduled:  chlorhexidine gluconate (MEDLINE KIT)  15 mL Mouth Rinse BID   mouth rinse  15 mL Mouth Rinse 10 times per day   Assessment: 56yo gentleman with pmhx HTN, HL, no personal hx seizure who presented after convulsive event. Patient was shopping for shoes, looked up at a high shelf and began to feel lightheaded; he then fell to the floor, convulsed for several minutes and bit his tongue, after which he was combative, then lethargic.  - Given length of convulsions, tongue biting, and post-ictal combativeness/lethargy/disorientation favor  seizure, less likely convulsive syncope. - MRI brain: No definite seizure etiology is seen; however, possible mildly increased T2 signal in the bilateral hippocampi may be related to recent seizure. No other acute intracranial process. No abnormal enhancement on follow up MRI with contrast.  - Negative CTA head and neck. Several incidental normal anatomic variations. Given the negative CTA, vertebrobasilar insufficiency is unlikely to be the etiology for the patient's loss of consciousness.   Recommendations: - rEEG - No indication for AED for first time seizure unless EEG is abnormal.  - TTE - Cardiogenic versus orthostatic syncope work up per primary team.  - Cardiac telemetry   LOS: 0 days   @Electronically  signed: Dr. EKerney Elbe3/11/2021  8:27 AM

## 2021-08-10 NOTE — Progress Notes (Signed)
Progress Note  Patient: John PelRichard Offield ZOX:[id number] DOB: August 10, 1965  DOA: 08/09/2021  DOS: 08/10/2021    Brief hospital course: John Galvan is a 56 y.o. male with a history of dyslipidemia on pravastatin who was brought to the ED 3/5 by EMS after having seizure-like activity in the [place]. After looking upward he felt lightheaded, fell to floor losing consciousness, had convulsions for several minutes, sustaining tongue laceration. He was lethargic and transiently combative after the episode. In the ED his neurological exam was normal, head CT without acute intracranial pathology. Brain MRI similarly was unremarkable and CTA head and neck showed no significant stenosis. Echo and EEG are pending.  Assessment and Plan: * Seizure [facility](HCC) Suspect first episode of seizure, less likely convulsive syncope. No dysrhythmias detected as of yet on telemetry monitoring.  - Continue seizure precautions, holding AED pending neurology recommendations.  - Echo pending - EEG pending. - Driving restrictions discussed with patient. - Recommend getting enough rest, staying hydrated, etc.   Subjective: Feeling back to normal this morning. No confusion, no weakness/numbness. Able to eat and ambulate. Recently changed jobs as the previous one was very stressful, he's been getting too little sleep. Drinks alcohol maybe once a month.  Objective: Vitals:   08/10/21 0600 08/10/21 0900 08/10/21 1158 08/10/21 1251  BP: 121/68 118/74 120/80 113/71  Pulse: 70 75 80 (!) 59  Resp: 18 20 20 15   Temp:  98.2 F (36.8 C) 98.6 F (37 C) 98.3 F (36.8 C)  TempSrc:  Oral Oral Oral  SpO2: 96% 98% 96% 100%  Weight:      Height:       Gen: Well-appearing 56 y.o. male in no distress HEENT: Superficial injuries to right side of tongue, hemostatic. Pulm: Nonlabored breathing room air. Clear CV: Regular rate and rhythm. No murmur, rub, or gallop. No JVD, no dependent edema. No carotid bruit. GI: Abdomen soft, non-tender,  non-distended, with normoactive bowel sounds.  Ext: Warm, no deformities Skin: No rashes, lesions or ulcers on visualized skin. Neuro: Alert and oriented. No focal neurological deficits. Psych: Judgement and insight appear fair. Mood euthymic & affect congruent. Behavior is appropriate.    Data Personally reviewed: CBC: Recent Labs  Lab 08/09/21 1343 08/10/21 0631  WBC 6.7 7.6  NEUTROABS 3.2  --   HGB 14.6 13.3  HCT 42.4 38.7*  MCV 86.7 87.6  PLT 297 236   Basic Metabolic Panel: Recent Labs  Lab 08/09/21 1343 08/09/21 1647 08/10/21 0631  NA 138  --  139  K 3.4*  --  3.9  CL 105  --  108  CO2 21*  --  25  GLUCOSE 121*  --  100*  BUN 16  --  14  CREATININE 1.12  --  1.07  CALCIUM 9.0  --  8.8*  MG  --  2.1  --    GFR: Estimated Creatinine Clearance: 73.8 mL/min (by C-G formula based on SCr of 1.07 mg/dL). Liver Function Tests: Recent Labs  Lab 08/09/21 1343  AST 36  ALT 27  ALKPHOS 53  BILITOT 0.7  PROT 7.8  ALBUMIN 4.5   No results for input(s): LIPASE, AMYLASE in the last 168 hours. No results for input(s): AMMONIA in the last 168 hours. Coagulation Profile: No results for input(s): INR, PROTIME in the last 168 hours. Cardiac Enzymes: No results for input(s): CKTOTAL, CKMB, CKMBINDEX, TROPONINI in the last 168 hours. BNP (last 3 results) No results for input(s): PROBNP in the last 8760 hours. HbA1C:  No results for input(s): HGBA1C in the last 72 hours. CBG: Recent Labs  Lab 08/09/21 1351  GLUCAP 112*   Lipid Profile: No results for input(s): CHOL, HDL, LDLCALC, TRIG, CHOLHDL, LDLDIRECT in the last 72 hours. Thyroid Function Tests: Recent Labs    08/09/21 1647  TSH 2.577   Anemia Panel: No results for input(s): VITAMINB12, FOLATE, FERRITIN, TIBC, IRON, RETICCTPCT in the last 72 hours. Urine analysis: No results found for: COLORURINE, APPEARANCEUR, LABSPEC, PHURINE, GLUCOSEU, HGBUR, BILIRUBINUR, KETONESUR, PROTEINUR, UROBILINOGEN, NITRITE,  LEUKOCYTESUR Recent Results (from the past 240 hour(s))  Resp Panel by RT-PCR (Flu A&B, Covid) Nasopharyngeal Swab     Status: None   Collection Time: 08/09/21  2:13 PM   Specimen: Nasopharyngeal Swab; Nasopharyngeal(NP) swabs in vial transport medium  Result Value Ref Range Status   SARS Coronavirus 2 by RT PCR NEGATIVE NEGATIVE Final    Comment: (NOTE) SARS-CoV-2 target nucleic acids are NOT DETECTED.  The SARS-CoV-2 RNA is generally detectable in upper respiratory specimens during the acute phase of infection. The lowest concentration of SARS-CoV-2 viral copies this assay can detect is 138 copies/mL. A negative result does not preclude SARS-Cov-2 infection and should not be used as the sole basis for treatment or other patient management decisions. A negative result may occur with  improper specimen collection/handling, submission of specimen other than nasopharyngeal swab, presence of viral mutation(s) within the areas targeted by this assay, and inadequate number of viral copies(<138 copies/mL). A negative result must be combined with clinical observations, patient history, and epidemiological information. The expected result is Negative.  Fact Sheet for Patients:  BloggerCourse.com  Fact Sheet for Healthcare Providers:  SeriousBroker.it  This test is no t yet approved or cleared by the [place] FDA and  has been authorized for detection and/or diagnosis of SARS-CoV-2 by FDA under an Emergency Use Authorization (EUA). This EUA will remain  in effect (meaning this test can be used) for the duration of the COVID-19 declaration under Section 564(b)(1) of the Act, 21 U.S.C.section 360bbb-3(b)(1), unless the authorization is terminated  or revoked sooner.       Influenza A by PCR NEGATIVE NEGATIVE Final   Influenza B by PCR NEGATIVE NEGATIVE Final    Comment: (NOTE) The Xpert Xpress SARS-CoV-2/FLU/RSV plus assay is  intended as an aid in the diagnosis of influenza from Nasopharyngeal swab specimens and should not be used as a sole basis for treatment. Nasal washings and aspirates are unacceptable for Xpert Xpress SARS-CoV-2/FLU/RSV testing.  Fact Sheet for Patients: BloggerCourse.com  Fact Sheet for Healthcare Providers: SeriousBroker.it  This test is not yet approved or cleared by the [place] FDA and has been authorized for detection and/or diagnosis of SARS-CoV-2 by FDA under an Emergency Use Authorization (EUA). This EUA will remain in effect (meaning this test can be used) for the duration of the COVID-19 declaration under Section 564(b)(1) of the Act, 21 U.S.C. section 360bbb-3(b)(1), unless the authorization is terminated or revoked.  Performed at [facility], [address] [place], [place] [address]      CT [facility] HEAD NECK W WO CM  Result Date: 08/10/2021 CLINICAL DATA:  56 year old male with syncope,  seizure activity. EXAM: CT ANGIOGRAPHY HEAD AND NECK TECHNIQUE: Multidetector CT imaging of the head and neck was performed using the standard protocol during bolus administration of intravenous contrast. Multiplanar CT image reconstructions and MIPs were obtained to evaluate the vascular anatomy. Carotid stenosis measurements (when applicable) are obtained utilizing NASCET criteria, using the distal  internal carotid diameter as the denominator. RADIATION DOSE REDUCTION: This exam was performed according to the departmental dose-optimization program which includes automated exposure control, adjustment of the mA and/or kV according to patient size and/or use of iterative reconstruction technique. CONTRAST:  80mL OMNIPAQUE IOHEXOL 350 MG/ML SOLN COMPARISON:  Brain MRI 08/09/2021 and this morning. Head CT 08/09/2021. FINDINGS: CT HEAD Brain: Stable non contrast CT appearance of the brain. Left lentiform perivascular space. Calvarium  and skull base: Negative. Paranasal sinuses: Visualized paranasal sinuses and mastoids are stable and well aerated. Orbits: Visualized orbits and scalp soft tissues are within normal limits. CTA NECK Skeleton: Negative. Upper chest: Minor upper lung atelectasis. Other neck: Mild motion artifact at the hypopharynx. Otherwise negative. Aortic arch: 4 vessel arch configuration, left vertebral artery arises directly from the arch. No arch atherosclerosis. Right carotid system: Negative. Left carotid system: Negative. Vertebral arteries: Negative; the right vertebral artery is dominant and the left arises directly from the aortic arch. CTA HEAD Posterior circulation: Negative posterior vertebral arteries and vertebrobasilar junction, mildly dominant right V4. Patent PICA origins. Patent basilar artery without stenosis. Normal SCA and PCA origins. Posterior communicating arteries are diminutive or absent. Bilateral PCA branches are within normal limits. Anterior circulation: Right ICA siphons are patent and normal. Normal ophthalmic artery origins. Patent and normal carotid termini, MCA and ACA origins. Normal anterior communicating artery. Bilateral ACA branches are within normal limits. Right MCA M1 segment, bifurcation, and right MCA branches are within normal limits. The left MCA bifurcates directly off the ICA terminus (series 14, image 24, "duplicated" M1 appearance). Left MCA branches are within normal limits. Venous sinuses: Patent. Anatomic variants: Non dominant left vertebral artery arises directly from the aortic arch. Left MCA bifurcation directly from the left ICA terminus. Review of the MIP images confirms the above findings IMPRESSION: 1. Negative CTA head and neck. Several incidental normal anatomic variations. 2. Stable and negative noncontrast CT appearance of the brain. Electronically Signed   By: Odessa Fleming M.D.   On: 08/10/2021 07:27   CT Head Wo Contrast  Result Date: 08/09/2021 CLINICAL DATA:   Seizure, new onset. EXAM: CT HEAD WITHOUT CONTRAST CT CERVICAL SPINE WITHOUT CONTRAST TECHNIQUE: Multidetector CT imaging of the head and cervical spine was performed following the standard protocol without intravenous contrast. Multiplanar CT image reconstructions of the cervical spine were also generated. RADIATION DOSE REDUCTION: This exam was performed according to the departmental dose-optimization program which includes automated exposure control, adjustment of the mA and/or kV according to patient size and/or use of iterative reconstruction technique. COMPARISON:  None. FINDINGS: CT HEAD FINDINGS Brain: No evidence of acute infarction, hemorrhage, hydrocephalus, extra-axial collection or mass lesion/mass effect. Vascular: No hyperdense vessel or unexpected calcification. Skull: Normal. Negative for fracture or focal lesion. Sinuses/Orbits: Visualized globes and orbits are unremarkable. Visualized sinuses are clear. Other: None. CT CERVICAL SPINE FINDINGS Alignment: Normal. Skull base and vertebrae: No acute fracture. No primary bone lesion or focal pathologic process. Soft tissues and spinal canal: No prevertebral fluid or swelling. No visible canal hematoma. Disc levels: Mild loss of disc height at C4-C5. Minor loss of disc height at C5-C6 and C6-C7. No significant disc bulging. No evidence of a disc herniation. No stenosis. Upper chest: Negative. Other: None. IMPRESSION: HEAD CT 1. Normal. CERVICAL CT 1. No fracture or acute finding. Electronically Signed   By: Amie Portland M.D.   On: 08/09/2021 14:40   CT Cervical Spine Wo Contrast  Result Date: 08/09/2021 CLINICAL DATA:  Seizure,  new onset. EXAM: CT HEAD WITHOUT CONTRAST CT CERVICAL SPINE WITHOUT CONTRAST TECHNIQUE: Multidetector CT imaging of the head and cervical spine was performed following the standard protocol without intravenous contrast. Multiplanar CT image reconstructions of the cervical spine were also generated. RADIATION DOSE REDUCTION:  This exam was performed according to the departmental dose-optimization program which includes automated exposure control, adjustment of the mA and/or kV according to patient size and/or use of iterative reconstruction technique. COMPARISON:  None. FINDINGS: CT HEAD FINDINGS Brain: No evidence of acute infarction, hemorrhage, hydrocephalus, extra-axial collection or mass lesion/mass effect. Vascular: No hyperdense vessel or unexpected calcification. Skull: Normal. Negative for fracture or focal lesion. Sinuses/Orbits: Visualized globes and orbits are unremarkable. Visualized sinuses are clear. Other: None. CT CERVICAL SPINE FINDINGS Alignment: Normal. Skull base and vertebrae: No acute fracture. No primary bone lesion or focal pathologic process. Soft tissues and spinal canal: No prevertebral fluid or swelling. No visible canal hematoma. Disc levels: Mild loss of disc height at C4-C5. Minor loss of disc height at C5-C6 and C6-C7. No significant disc bulging. No evidence of a disc herniation. No stenosis. Upper chest: Negative. Other: None. IMPRESSION: HEAD CT 1. Normal. CERVICAL CT 1. No fracture or acute finding. Electronically Signed   By: Amie Portland M.D.   On: 08/09/2021 14:40   MR BRAIN WO CONTRAST  Result Date: 08/09/2021 CLINICAL DATA:  Seizure, new onset EXAM: MRI HEAD WITHOUT CONTRAST TECHNIQUE: Multiplanar, multiecho pulse sequences of the brain and surrounding structures were obtained without intravenous contrast. COMPARISON:  Prior MRI, correlation is made with CT head 10/09/2021 FINDINGS: Brain: No restricted diffusion to suggest acute or subacute infarct. No acute hemorrhage mass, mass effect, or midline shift. Dilated perivascular spaces in the bilateral basal ganglia. No hydrocephalus or extra-axial collection. The hippocampi are symmetric in size and signal, although there may be slightly increased T2 signal in the hippocampi bilaterally. No heterotopia or cortical dysgenesis. Vascular: Normal  flow voids. Skull and upper cervical spine: Normal marrow signal. Sinuses/Orbits: Negative. Other: The mastoids are well aerated. IMPRESSION: 1. No definite seizure etiology is seen; however, possible mildly increased T2 signal in the bilateral hippocampi may be related to recent seizure. 2. No other acute intracranial process. Electronically Signed   By: Wiliam Ke M.D.   On: 08/09/2021 19:49   MR BRAIN W CONTRAST  Result Date: 08/10/2021 CLINICAL DATA:  56 year old male with seizure activity. Questionable increased hippocampal T2 hyperintensity on noncontrast MRI yesterday. EXAM: MRI HEAD WITH CONTRAST TECHNIQUE: Multiplanar, multiecho pulse sequences of the brain and surrounding structures were obtained with intravenous contrast. CONTRAST:  4mL GADAVIST GADOBUTROL 1 MMOL/ML IV SOLN COMPARISON:  MRI without contrast 08/09/2021. FINDINGS: Post-contrast axial and coronal T1 weighted imaging. Major dural venous sinuses are enhancing and appear to be patent. No midline shift, mass effect, or evidence of intracranial mass lesion. No ventriculomegaly. Stable visible scalp, face, orbits. No abnormal enhancement identified. No dural thickening. No hippocampal enhancement. IMPRESSION: Negative postcontrast MRI Brain. Electronically Signed   By: Odessa Fleming M.D.   On: 08/10/2021 06:52   ECHOCARDIOGRAM COMPLETE  Result Date: 08/10/2021    ECHOCARDIOGRAM REPORT   Patient Name:   LUIE LANEVE Date of Exam: 08/10/2021 Medical Rec #:  [id number]    Height:       65.0 in Accession #:    [id number]   Weight:       170.0 lb Date of Birth:  08/06/1965    BSA:  1.846 m Patient Age:    56 years     BP:           121/68 mmHg Patient Gender: M            HR:           65 bpm. Exam Location:  [facility] Procedure: 2D Echo, Color Doppler and Cardiac Doppler Indications:     R55 Syncope  History:         Patient has no prior history of Echocardiogram examinations.                  Risk Factors:Hypertension and Dyslipidemia.   Sonographer:     Humphrey Rolls Referring Phys:  [id number] HQIONGEX AGBATA Diagnosing Phys: Lorine Bears MD  Sonographer Comments: Suboptimal subcostal window. IMPRESSIONS  1. Left ventricular ejection fraction, by estimation, is 65 to 70%. The left ventricle has normal function. The left ventricle has no regional wall motion abnormalities. Left ventricular diastolic parameters were normal.  2. Right ventricular systolic function is normal. The right ventricular size is normal. There is normal pulmonary artery systolic pressure.  3. The mitral valve is normal in structure. Trivial mitral valve regurgitation. No evidence of mitral stenosis.  4. The aortic valve is normal in structure. Aortic valve regurgitation is not visualized. Aortic valve sclerosis is present, with no evidence of aortic valve stenosis.  5. The inferior vena cava is normal in size with greater than 50% respiratory variability, suggesting right atrial pressure of 3 mmHg. FINDINGS  Left Ventricle: Left ventricular ejection fraction, by estimation, is 65 to 70%. The left ventricle has normal function. The left ventricle has no regional wall motion abnormalities. The left ventricular internal cavity size was normal in size. There is  borderline left ventricular hypertrophy. Left ventricular diastolic parameters were normal. Right Ventricle: The right ventricular size is normal. No increase in right ventricular wall thickness. Right ventricular systolic function is normal. There is normal pulmonary artery systolic pressure. The tricuspid regurgitant velocity is 2.26 m/s, and  with an assumed right atrial pressure of 3 mmHg, the estimated right ventricular systolic pressure is 23.4 mmHg. Left Atrium: Left atrial size was normal in size. Right Atrium: Right atrial size was normal in size. Pericardium: There is no evidence of pericardial effusion. Mitral Valve: The mitral valve is normal in structure. Trivial mitral valve regurgitation. No evidence of mitral  valve stenosis. MV peak gradient, 2.7 mmHg. The mean mitral valve gradient is 1.0 mmHg. Tricuspid Valve: The tricuspid valve is normal in structure. Tricuspid valve regurgitation is trivial. No evidence of tricuspid stenosis. Aortic Valve: The aortic valve is normal in structure. Aortic valve regurgitation is not visualized. Aortic valve sclerosis is present, with no evidence of aortic valve stenosis. Aortic valve mean gradient measures 3.0 mmHg. Aortic valve peak gradient measures 4.8 mmHg. Aortic valve area, by VTI measures 2.60 cm. Pulmonic Valve: The pulmonic valve was normal in structure. Pulmonic valve regurgitation is not visualized. No evidence of pulmonic stenosis. Aorta: The aortic root is normal in size and structure. Venous: The inferior vena cava is normal in size with greater than 50% respiratory variability, suggesting right atrial pressure of 3 mmHg. IAS/Shunts: No atrial level shunt detected by color flow Doppler.  LEFT VENTRICLE PLAX 2D LVIDd:         4.34 cm   Diastology LVIDs:         2.34 cm   LV e' medial:    8.05 cm/s LV PW:  1.13 cm   LV E/e' medial:  8.5 LV IVS:        0.79 cm   LV e' lateral:   10.60 cm/s LVOT diam:     2.00 cm   LV E/e' lateral: 6.4 LV SV:         53 LV SV Index:   29 LVOT Area:     3.14 cm  RIGHT VENTRICLE RV Basal diam:  4.02 cm LEFT ATRIUM             Index        RIGHT ATRIUM           Index LA diam:        3.80 cm 2.06 cm/m   RA Area:     10.50 cm LA Vol (A2C):   31.2 ml 16.90 ml/m  RA Volume:   18.80 ml  10.18 ml/m LA Vol (A4C):   33.6 ml 18.20 ml/m LA Biplane Vol: 34.1 ml 18.47 ml/m  AORTIC VALVE                    PULMONIC VALVE AV Area (Vmax):    2.48 cm     PV Vmax:       1.00 m/s AV Area (Vmean):   2.34 cm     PV Vmean:      69.900 cm/s AV Area (VTI):     2.60 cm     PV VTI:        0.188 m AV Vmax:           110.00 cm/s  PV Peak grad:  4.0 mmHg AV Vmean:          73.700 cm/s  PV Mean grad:  2.0 mmHg AV VTI:            0.204 m AV Peak Grad:       4.8 mmHg AV Mean Grad:      3.0 mmHg LVOT Vmax:         86.80 cm/s LVOT Vmean:        54.900 cm/s LVOT VTI:          0.169 m LVOT/AV VTI ratio: 0.83  AORTA Ao Root diam: 3.40 cm MITRAL VALVE               TRICUSPID VALVE MV Area (PHT): 2.85 cm    TR Peak grad:   20.4 mmHg MV Area VTI:   2.17 cm    TR Vmax:        226.00 cm/s MV Peak grad:  2.7 mmHg MV Mean grad:  1.0 mmHg    SHUNTS MV Vmax:       0.82 m/s    Systemic VTI:  0.17 m MV Vmean:      51.6 cm/s   Systemic Diam: 2.00 cm MV Decel Time: 266 msec MV E velocity: 68.10 cm/s MV A velocity: 65.60 cm/s MV E/A ratio:  1.04 Lorine BearsMuhammad Arida MD Electronically signed by Lorine BearsMuhammad Arida MD Signature Date/Time: 08/10/2021/1:43:15 PM    Final     Family Communication: None at bedside  Disposition: Status is: Inpatient Remains inpatient appropriate because: Needs EEG, echo, neurology evaluation Planned Discharge Destination: Home      Tyrone Nineyan B Deron Poole, MD 08/10/2021 1:49 PM Page by [email or web address]

## 2021-08-10 NOTE — Consult Note (Signed)
NEUROLOGY CONSULTATION NOTE  ? ?Date of service: August 10, 2021 ?Patient Name: John Galvan ?MRN:  [id number] ?DOB:  [birth year] ?Reason for consult: first time seizure ?Requesting physician: Dr. Francine Graven ?_ _ _   _ __   _ __ _ _  __ __   _ __   __ _ ? ?History of Present Illness  ? ?This is a 56 yo gentleman with pmhx HTN, HL, no personal hx seizure who presented after convulsive event. Patient was shopping for shoes, looked up at a high shelf and began to feel lightheaded. He then fell to the ground unresponsive, made a guttural cry, and had generalized convulsions for several min per wife and was "foaming at the mouth." Afterwards he was combative and disoriented, then lethargic. He has returned to baseline other than being slightly drowsy. He had severe tongue biting on the R side, no incontinence. Patient is admitted for observation and first time seizure workup. Head CT NAICP. CT c spine no fracture or acute finding. MRI brain wo contrast showed bilateral T2 hyperintensity likely sequelae of recent seizure (personal review). ?  ?ROS  ? ?Per HPI: all other systems reviewed and are negative ? ?Past History  ? ?I have reviewed the following: ? ?Past Medical History:  ?Diagnosis Date  ? Hyperlipidemia   ? Hypertension   ? ?History reviewed. No pertinent surgical history. ?History reviewed. No pertinent family history. ?Social History  ? ?Socioeconomic History  ? Marital status: Married  ?  Spouse name: Not on file  ? Number of children: Not on file  ? Years of education: Not on file  ? Highest education level: Not on file  ?Occupational History  ? Not on file  ?Tobacco Use  ? Smoking status: Never  ? Smokeless tobacco: Never  ?Vaping Use  ? Vaping Use: Never used  ?Substance and Sexual Activity  ? Alcohol use: Never  ? Drug use: Never  ? Sexual activity: Not on file  ?Other Topics Concern  ? Not on file  ?Social History Narrative  ? Not on file  ? ?Social Determinants of Health  ? ?Financial Resource Strain: Not on file   ?Food Insecurity: Not on file  ?Transportation Needs: Not on file  ?Physical Activity: Not on file  ?Stress: Not on file  ?Social Connections: Not on file  ? ?Allergies  ?Allergen Reactions  ? Simvastatin   ?  Other reaction(s): muscle aches  ? ? ?Medications  ? ?(Not in a hospital admission) ?  ? ? ?Current Facility-Administered Medications:  ?  acetaminophen (TYLENOL) tablet 650 mg, 650 mg, Oral, Q4H PRN **OR** acetaminophen (TYLENOL) suppository 650 mg, 650 mg, Rectal, Q4H PRN, Agbata, Tochukwu, MD ?  chlorhexidine gluconate (MEDLINE KIT) (PERIDEX) 0.12 % solution 15 mL, 15 mL, Mouth Rinse, BID, Agbata, Tochukwu, MD ?  LORazepam (ATIVAN) injection 4 mg, 4 mg, Intravenous, Q5 Min x 2 PRN, Agbata, Tochukwu, MD ?  MEDLINE mouth rinse, 15 mL, Mouth Rinse, 10 times per day, Agbata, Tochukwu, MD ?  ondansetron (ZOFRAN) tablet 4 mg, 4 mg, Oral, Q6H PRN **OR** ondansetron (ZOFRAN) injection 4 mg, 4 mg, Intravenous, Q6H PRN, Agbata, Tochukwu, MD ? ?Current Outpatient Medications:  ?  ibuprofen (ADVIL) 200 MG tablet, Take 200 mg by mouth every 6 (six) hours as needed., Disp: , Rfl:  ?  Multiple Vitamins-Minerals (CENTRUM SILVER ADULT 50+ PO), Take 1 tablet by mouth daily., Disp: , Rfl:  ?  pravastatin (PRAVACHOL) 20 MG tablet, Take 20 mg by mouth daily., Disp: ,  Rfl:  ?  aspirin 81 MG chewable tablet, Chew 81 mg by mouth daily. (Patient not taking: Reported on 08/09/2021), Disp: , Rfl:  ? ?Vitals  ? ?Vitals:  ? 08/10/21 0300 08/10/21 0400 08/10/21 0515 08/10/21 0600  ?BP: 120/72 125/76 116/73 121/68  ?Pulse: (!) 54 (!) 53 (!) 49 70  ?Resp: _0 ?Temp:  97.9 ?F (36.6 ?C)    ?TempSrc:  Oral    ?SpO2: 96% 96% 97% 96%  ?Weight:      ?Height:      ?  ? ?Body mass index is 28.29 kg/m?. ? ?Physical Exam  ? ?Physical Exam ?Gen: slightly drowsy, Ox4, NAD ?HEENT: Atraumatic, normocephalic;mucous membranes moist; oropharynx clear, tongue without atrophy or fasciculations. ?Neck: Supple, trachea midline. ?Resp: CTAB, no  w/r/r ?CV: RRR, no m/g/r; nml S1 and S2. 2+ symmetric peripheral pulses. ?Abd: soft/NT/ND; nabs x 4 quad ?Extrem: Nml bulk; no cyanosis, clubbing, or edema. ? ?Neuro: ?*MS: slightly drowsy, O x4. Follows multi-step commands.  ?*Speech: fluid, nondysarthric, able to name and repeat ?*CN:  ?  I: Deferred ?  II,III: PERRLA, VFF by confrontation, optic discs unable to be visualized 2/2 pupillary constriction ?  III,IV,VI: EOMI w/o nystagmus, no ptosis ?  V: Sensation intact from V1 to V3 to LT ?  VII: Eyelid closure was full.  Smile symmetric. ?  VIII: Hearing intact to voice ?  IX,X: Voice normal, palate elevates symmetrically  ?  XI: SCM/trap 5/5 bilat   ?XII: Tongue protrudes midline, no atrophy or fasciculations  ? ?*Motor:   Normal bulk.  No tremor, rigidity or bradykinesia. No pronator drift. ? ?  Strength: Dlt Bic Tri WrE WrF FgS Gr HF KnF KnE PlF DoF  ?  Left _1 ?  Right _2 ? ? ?*Sensory: Intact to light touch, pinprick, temperature vibration throughout. Symmetric. Propioception intact bilat.  No double-simultaneous extinction.  ?*Coordination:  Finger-to-nose, heel-to-shin, rapid alternating motions were intact. ?*Reflexes:  2+ and symmetric throughout without clonus; toes down-going bilat ?*Gait: deferred ? ? ?Labs  ? ?CBC:  ?Recent Labs  ?Lab 08/09/21 ?1343  ?WBC 6.7  ?NEUTROABS 3.2  ?HGB 14.6  ?HCT 42.4  ?MCV 86.7  ?PLT 297  ? ? ?Basic Metabolic Panel:  ?Lab Results  ?Component Value Date  ? NA 138 08/09/2021  ? K 3.4 (L) 08/09/2021  ? CO2 21 (L) 08/09/2021  ? GLUCOSE 121 (H) 08/09/2021  ? BUN 16 08/09/2021  ? CREATININE 1.12 08/09/2021  ? CALCIUM 9.0 08/09/2021  ? GFRNONAA >60 08/09/2021  ? ?Lipid Panel: No results found for: [place] ?HgbA1c: No results found for: HGBA1C ?Urine Drug Screen: No results found for: LABOPIA, COCAINSCRNUR, [place], [place], THCU, LABBARB  ?Alcohol Level No results found for: ETH ? ? ?Impression  ? ?This is a 56 yo gentleman with pmhx  HTN, HL, no personal hx seizure who presented after convulsive event. Patient was shopping for shoes, looked up at a high shelf and began to feel lightheaded. Given length of convulsions, tongue biting, and post-ictal combativeness/lethargy/disorientation favor seizure, less likely convulsive syncope. ? ? ?Recommendations  ? ?- Add MRI brain w contrast sequences to MRI brain wo sequence overnight ?- CTA H&N given patient was looking up prior to event; rule out vertebrobasilar insufficiency ?- rEEG ?- No indication for AED for first time seizure unless EEG or additional MRI  brain sequences are abnormal ?- TTE ?- Tele ?- Will continue to follow ? ?______________________________________________________________________ ? ? ?Thank you for the opportunity to take part in the care of this patient. If you have any further questions, please contact the neurology consultation attending. ? ?Signed, ? ?Su Monks, MD ?[facility] ?[phone] ? ?If 7pm- 7am, please page neurology on call as listed in [place]. ? ?

## 2021-08-10 NOTE — Progress Notes (Signed)
Patient arrived to unit from ED.

## 2021-08-10 NOTE — Progress Notes (Signed)
Eeg done 

## 2021-08-10 NOTE — Progress Notes (Signed)
*  PRELIMINARY RESULTS* ?Echocardiogram ?2D Echocardiogram has been performed. ? ?John Galvan ?08/10/2021, 9:34 AM ?

## 2021-08-11 DIAGNOSIS — R569 Unspecified convulsions: Secondary | ICD-10-CM | POA: Diagnosis not present

## 2021-08-11 DIAGNOSIS — E876 Hypokalemia: Secondary | ICD-10-CM | POA: Diagnosis not present

## 2021-08-11 MED ORDER — LEVETIRACETAM 500 MG PO TABS
500.0000 mg | ORAL_TABLET | Freq: Two times a day (BID) | ORAL | Status: DC
Start: 2021-08-11 — End: 2021-08-11
  Administered 2021-08-11: 500 mg via ORAL
  Filled 2021-08-11: qty 1

## 2021-08-11 MED ORDER — LEVETIRACETAM 500 MG PO TABS: 500.0000 mg | ORAL_TABLET | Freq: Two times a day (BID) | ORAL | 1 refills | Status: DC

## 2021-08-11 NOTE — Discharge Summary (Signed)
Physician Discharge Summary   Patient: John Galvan MRN: [id number] DOB: 03-26-1966  Admit date:     08/09/2021  Discharge date: 08/11/21  Discharge Physician: Patrecia Pour   PCP: Pcp, No   Recommendations at discharge:  Follow up with [facility] for management of seizures, started on keppra after episode of convulsive seizure.   Discharge Diagnoses: Principal Problem:   Seizure John Galvan) Active Problems:   Hypokalemia  Hospital Course: John Galvan is a 56 y.o. male with a history of dyslipidemia on pravastatin who was brought to the ED 3/5 by EMS after having seizure-like activity in the [place]. After looking upward he felt lightheaded, fell to floor losing consciousness, had convulsions for several minutes, sustaining tongue laceration. He was lethargic and transiently combative after the episode. In the ED his neurological exam was normal, head CT without acute intracranial pathology. Brain MRI similarly was unremarkable and CTA head and neck showed no significant stenosis. Echo unremarkable and EEG within normal limits. With shared-decision making between patient, family, and neurology, we will start keppra 500mg  po BID and have the patient follow up as outpatient with neurology.  Assessment and Plan: * Seizure [facility]) Suspect first episode of seizure, less likely convulsive syncope. No dysrhythmias detected as of yet on telemetry monitoring.  - Start keppra. - Driving restrictions discussed with patient. - Recommend getting enough rest, staying hydrated, etc.   EEG: IMPRESSION: This study is within normal limits. No seizures or epileptiform discharges were seen throughout the recording.  Consultants: Neurology, Dr. Cheral Marker Procedures performed: EEG  Disposition: Home Diet recommendation:  Cardiac diet DISCHARGE MEDICATION: Allergies as of 08/11/2021       Reactions   Simvastatin    Other reaction(s): muscle aches        Medication List     STOP taking these  medications    aspirin 81 MG chewable tablet       TAKE these medications    CENTRUM SILVER ADULT 50+ PO Take 1 tablet by mouth daily.   ibuprofen 200 MG tablet Commonly known as: ADVIL Take 200 mg by mouth every 6 (six) hours as needed.   levETIRAcetam 500 MG tablet Commonly known as: KEPPRA Take 1 tablet (500 mg total) by mouth 2 (two) times daily.   pravastatin 20 MG tablet Commonly known as: PRAVACHOL Take 20 mg by mouth daily.        Discharge Exam: Filed Weights   08/09/21 1342  Weight: 77.1 kg   Well-appearing, no distress, steady gait, nonfocal.  Condition at discharge: good  The results of significant diagnostics from this hospitalization (including imaging, microbiology, ancillary and laboratory) are listed below for reference.   Imaging Studies: CT ANGIO HEAD NECK W WO CM  Result Date: 08/10/2021 CLINICAL DATA:  56 year old male with syncope,  seizure activity. EXAM: CT ANGIOGRAPHY HEAD AND NECK TECHNIQUE: Multidetector CT imaging of the head and neck was performed using the standard protocol during bolus administration of intravenous contrast. Multiplanar CT image reconstructions and MIPs were obtained to evaluate the vascular anatomy. Carotid stenosis measurements (when applicable) are obtained utilizing NASCET criteria, using the distal internal carotid diameter as the denominator. RADIATION DOSE REDUCTION: This exam was performed according to the departmental dose-optimization program which includes automated exposure control, adjustment of the mA and/or kV according to patient size and/or use of iterative reconstruction technique. CONTRAST:  58mL OMNIPAQUE IOHEXOL 350 MG/ML SOLN COMPARISON:  Brain MRI 08/09/2021 and this morning. Head CT 08/09/2021. FINDINGS: CT HEAD Brain: Stable non  contrast CT appearance of the brain. Left lentiform perivascular space. Calvarium and skull base: Negative. Paranasal sinuses: Visualized paranasal sinuses and mastoids are  stable and well aerated. Orbits: Visualized orbits and scalp soft tissues are within normal limits. CTA NECK Skeleton: Negative. Upper chest: Minor upper lung atelectasis. Other neck: Mild motion artifact at the hypopharynx. Otherwise negative. Aortic arch: 4 vessel arch configuration, left vertebral artery arises directly from the arch. No arch atherosclerosis. Right carotid system: Negative. Left carotid system: Negative. Vertebral arteries: Negative; the right vertebral artery is dominant and the left arises directly from the aortic arch. CTA HEAD Posterior circulation: Negative posterior vertebral arteries and vertebrobasilar junction, mildly dominant right V4. Patent PICA origins. Patent basilar artery without stenosis. Normal SCA and PCA origins. Posterior communicating arteries are diminutive or absent. Bilateral PCA branches are within normal limits. Anterior circulation: Right ICA siphons are patent and normal. Normal ophthalmic artery origins. Patent and normal carotid termini, MCA and ACA origins. Normal anterior communicating artery. Bilateral ACA branches are within normal limits. Right MCA M1 segment, bifurcation, and right MCA branches are within normal limits. The left MCA bifurcates directly off the ICA terminus (series 14, image 24, "duplicated" M1 appearance). Left MCA branches are within normal limits. Venous sinuses: Patent. Anatomic variants: Non dominant left vertebral artery arises directly from the aortic arch. Left MCA bifurcation directly from the left ICA terminus. Review of the MIP images confirms the above findings IMPRESSION: 1. Negative CTA head and neck. Several incidental normal anatomic variations. 2. Stable and negative noncontrast CT appearance of the brain. Electronically Signed   By: Genevie Ann M.D.   On: 08/10/2021 07:27   CT Head Wo Contrast  Result Date: 08/09/2021 CLINICAL DATA:  Seizure, new onset. EXAM: CT HEAD WITHOUT CONTRAST CT CERVICAL SPINE WITHOUT CONTRAST  TECHNIQUE: Multidetector CT imaging of the head and cervical spine was performed following the standard protocol without intravenous contrast. Multiplanar CT image reconstructions of the cervical spine were also generated. RADIATION DOSE REDUCTION: This exam was performed according to the departmental dose-optimization program which includes automated exposure control, adjustment of the mA and/or kV according to patient size and/or use of iterative reconstruction technique. COMPARISON:  None. FINDINGS: CT HEAD FINDINGS Brain: No evidence of acute infarction, hemorrhage, hydrocephalus, extra-axial collection or mass lesion/mass effect. Vascular: No hyperdense vessel or unexpected calcification. Skull: Normal. Negative for fracture or focal lesion. Sinuses/Orbits: Visualized globes and orbits are unremarkable. Visualized sinuses are clear. Other: None. CT CERVICAL SPINE FINDINGS Alignment: Normal. Skull base and vertebrae: No acute fracture. No primary bone lesion or focal pathologic process. Soft tissues and spinal canal: No prevertebral fluid or swelling. No visible canal hematoma. Disc levels: Mild loss of disc height at C4-C5. Minor loss of disc height at C5-C6 and C6-C7. No significant disc bulging. No evidence of a disc herniation. No stenosis. Upper chest: Negative. Other: None. IMPRESSION: HEAD CT 1. Normal. CERVICAL CT 1. No fracture or acute finding. Electronically Signed   By: Lajean Manes M.D.   On: 08/09/2021 14:40   CT Cervical Spine Wo Contrast  Result Date: 08/09/2021 CLINICAL DATA:  Seizure, new onset. EXAM: CT HEAD WITHOUT CONTRAST CT CERVICAL SPINE WITHOUT CONTRAST TECHNIQUE: Multidetector CT imaging of the head and cervical spine was performed following the standard protocol without intravenous contrast. Multiplanar CT image reconstructions of the cervical spine were also generated. RADIATION DOSE REDUCTION: This exam was performed according to the departmental dose-optimization program which  includes automated exposure control, adjustment of the mA  and/or kV according to patient size and/or use of iterative reconstruction technique. COMPARISON:  None. FINDINGS: CT HEAD FINDINGS Brain: No evidence of acute infarction, hemorrhage, hydrocephalus, extra-axial collection or mass lesion/mass effect. Vascular: No hyperdense vessel or unexpected calcification. Skull: Normal. Negative for fracture or focal lesion. Sinuses/Orbits: Visualized globes and orbits are unremarkable. Visualized sinuses are clear. Other: None. CT CERVICAL SPINE FINDINGS Alignment: Normal. Skull base and vertebrae: No acute fracture. No primary bone lesion or focal pathologic process. Soft tissues and spinal canal: No prevertebral fluid or swelling. No visible canal hematoma. Disc levels: Mild loss of disc height at C4-C5. Minor loss of disc height at C5-C6 and C6-C7. No significant disc bulging. No evidence of a disc herniation. No stenosis. Upper chest: Negative. Other: None. IMPRESSION: HEAD CT 1. Normal. CERVICAL CT 1. No fracture or acute finding. Electronically Signed   By: Lajean Manes M.D.   On: 08/09/2021 14:40   MR BRAIN WO CONTRAST  Result Date: 08/09/2021 CLINICAL DATA:  Seizure, new onset EXAM: MRI HEAD WITHOUT CONTRAST TECHNIQUE: Multiplanar, multiecho pulse sequences of the brain and surrounding structures were obtained without intravenous contrast. COMPARISON:  Prior MRI, correlation is made with CT head 10/09/2021 FINDINGS: Brain: No restricted diffusion to suggest acute or subacute infarct. No acute hemorrhage mass, mass effect, or midline shift. Dilated perivascular spaces in the bilateral basal ganglia. No hydrocephalus or extra-axial collection. The hippocampi are symmetric in size and signal, although there may be slightly increased T2 signal in the hippocampi bilaterally. No heterotopia or cortical dysgenesis. Vascular: Normal flow voids. Skull and upper cervical spine: Normal marrow signal. Sinuses/Orbits:  Negative. Other: The mastoids are well aerated. IMPRESSION: 1. No definite seizure etiology is seen; however, possible mildly increased T2 signal in the bilateral hippocampi may be related to recent seizure. 2. No other acute intracranial process. Electronically Signed   By: Merilyn Baba M.D.   On: 08/09/2021 19:49   MR BRAIN W CONTRAST  Result Date: 08/10/2021 CLINICAL DATA:  56 year old male with seizure activity. Questionable increased hippocampal T2 hyperintensity on noncontrast MRI yesterday. EXAM: MRI HEAD WITH CONTRAST TECHNIQUE: Multiplanar, multiecho pulse sequences of the brain and surrounding structures were obtained with intravenous contrast. CONTRAST:  34mL GADAVIST GADOBUTROL 1 MMOL/ML IV SOLN COMPARISON:  MRI without contrast 08/09/2021. FINDINGS: Post-contrast axial and coronal T1 weighted imaging. Major dural venous sinuses are enhancing and appear to be patent. No midline shift, mass effect, or evidence of intracranial mass lesion. No ventriculomegaly. Stable visible scalp, face, orbits. No abnormal enhancement identified. No dural thickening. No hippocampal enhancement. IMPRESSION: Negative postcontrast MRI Brain. Electronically Signed   By: Genevie Ann M.D.   On: 08/10/2021 06:52   EEG adult  Result Date: 08/11/2021 Lora Havens, MD     08/11/2021  8:26 AM Patient Name: Ferry Hottle MRN: [id number] Epilepsy Attending: Lora Havens Referring Physician/Provider: Collier Bullock, MD Date: 08/10/2021 Duration: 33.07 mins Patient history: 56 yo gentleman with pmhx HTN, HL, no personal hx seizure who presented after convulsive event. Patient was shopping for shoes, looked up at a high shelf and began to feel lightheaded.  EEG to evaluate for seizures. Level of alertness: Awake, asleep AEDs during EEG study: None Technical aspects: This EEG study was done with scalp electrodes positioned according to the 10-20 International system of electrode placement. Electrical activity was acquired at a  sampling rate of 500Hz  and reviewed with a high frequency filter of 70Hz  and a low frequency filter of 1Hz . EEG data were recorded  continuously and digitally stored. Description: The posterior dominant rhythm consists of 9 Hz activity of moderate voltage (25-35 uV) seen predominantly in posterior head regions, symmetric and reactive to eye opening and eye closing. Sleep was characterized by vertex waves, sleep spindles (12 to 14 Hz), maximal frontocentral region. Physiologic photic driving was not seen during photic stimulation.  Hyperventilation was not performed.   IMPRESSION: This study is within normal limits. No seizures or epileptiform discharges were seen throughout the recording. Lora Havens   ECHOCARDIOGRAM COMPLETE  Result Date: 08/10/2021    ECHOCARDIOGRAM REPORT   Patient Name:   JERMELLE MAYCOCK Date of Exam: 08/10/2021 Medical Rec #:  [id number]    Height:       65.0 in Accession #:    [id number]   Weight:       170.0 lb Date of Birth:  1966-01-13    BSA:          1.846 m Patient Age:    56 years     BP:           121/68 mmHg Patient Gender: M            HR:           65 bpm. Exam Location:  [facility] Procedure: 2D Echo, Color Doppler and Cardiac Doppler Indications:     R55 Syncope  History:         Patient has no prior history of Echocardiogram examinations.                  Risk Factors:Hypertension and Dyslipidemia.  Sonographer:     Charmayne Sheer Referring Phys:  [place] Diagnosing Phys: Kathlyn Sacramento MD  Sonographer Comments: Suboptimal subcostal window. IMPRESSIONS  1. Left ventricular ejection fraction, by estimation, is 65 to 70%. The left ventricle has normal function. The left ventricle has no regional wall motion abnormalities. Left ventricular diastolic parameters were normal.  2. Right ventricular systolic function is normal. The right ventricular size is normal. There is normal pulmonary artery systolic pressure.  3. The mitral valve is normal in structure. Trivial mitral valve  regurgitation. No evidence of mitral stenosis.  4. The aortic valve is normal in structure. Aortic valve regurgitation is not visualized. Aortic valve sclerosis is present, with no evidence of aortic valve stenosis.  5. The inferior vena cava is normal in size with greater than 50% respiratory variability, suggesting right atrial pressure of 3 mmHg. FINDINGS  Left Ventricle: Left ventricular ejection fraction, by estimation, is 65 to 70%. The left ventricle has normal function. The left ventricle has no regional wall motion abnormalities. The left ventricular internal cavity size was normal in size. There is  borderline left ventricular hypertrophy. Left ventricular diastolic parameters were normal. Right Ventricle: The right ventricular size is normal. No increase in right ventricular wall thickness. Right ventricular systolic function is normal. There is normal pulmonary artery systolic pressure. The tricuspid regurgitant velocity is 2.26 m/s, and  with an assumed right atrial pressure of 3 mmHg, the estimated right ventricular systolic pressure is 0000000 mmHg. Left Atrium: Left atrial size was normal in size. Right Atrium: Right atrial size was normal in size. Pericardium: There is no evidence of pericardial effusion. Mitral Valve: The mitral valve is normal in structure. Trivial mitral valve regurgitation. No evidence of mitral valve stenosis. MV peak gradient, 2.7 mmHg. The mean mitral valve gradient is 1.0 mmHg. Tricuspid Valve: The tricuspid valve is normal in structure. Tricuspid valve regurgitation is  trivial. No evidence of tricuspid stenosis. Aortic Valve: The aortic valve is normal in structure. Aortic valve regurgitation is not visualized. Aortic valve sclerosis is present, with no evidence of aortic valve stenosis. Aortic valve mean gradient measures 3.0 mmHg. Aortic valve peak gradient measures 4.8 mmHg. Aortic valve area, by VTI measures 2.60 cm. Pulmonic Valve: The pulmonic valve was normal in  structure. Pulmonic valve regurgitation is not visualized. No evidence of pulmonic stenosis. Aorta: The aortic root is normal in size and structure. Venous: The inferior vena cava is normal in size with greater than 50% respiratory variability, suggesting right atrial pressure of 3 mmHg. IAS/Shunts: No atrial level shunt detected by color flow Doppler.  LEFT VENTRICLE PLAX 2D LVIDd:         4.34 cm   Diastology LVIDs:         2.34 cm   LV e' medial:    8.05 cm/s LV PW:         1.13 cm   LV E/e' medial:  8.5 LV IVS:        0.79 cm   LV e' lateral:   10.60 cm/s LVOT diam:     2.00 cm   LV E/e' lateral: 6.4 LV SV:         53 LV SV Index:   29 LVOT Area:     3.14 cm  RIGHT VENTRICLE RV Basal diam:  4.02 cm LEFT ATRIUM             Index        RIGHT ATRIUM           Index LA diam:        3.80 cm 2.06 cm/m   RA Area:     10.50 cm LA Vol (A2C):   31.2 ml 16.90 ml/m  RA Volume:   18.80 ml  10.18 ml/m LA Vol (A4C):   33.6 ml 18.20 ml/m LA Biplane Vol: 34.1 ml 18.47 ml/m  AORTIC VALVE                    PULMONIC VALVE AV Area (Vmax):    2.48 cm     PV Vmax:       1.00 m/s AV Area (Vmean):   2.34 cm     PV Vmean:      69.900 cm/s AV Area (VTI):     2.60 cm     PV VTI:        0.188 m AV Vmax:           110.00 cm/s  PV Peak grad:  4.0 mmHg AV Vmean:          73.700 cm/s  PV Mean grad:  2.0 mmHg AV VTI:            0.204 m AV Peak Grad:      4.8 mmHg AV Mean Grad:      3.0 mmHg LVOT Vmax:         86.80 cm/s LVOT Vmean:        54.900 cm/s LVOT VTI:          0.169 m LVOT/AV VTI ratio: 0.83  AORTA Ao Root diam: 3.40 cm MITRAL VALVE               TRICUSPID VALVE MV Area (PHT): 2.85 cm    TR Peak grad:   20.4 mmHg MV Area VTI:   2.17 cm    TR Vmax:  226.00 cm/s MV Peak grad:  2.7 mmHg MV Mean grad:  1.0 mmHg    SHUNTS MV Vmax:       0.82 m/s    Systemic VTI:  0.17 m MV Vmean:      51.6 cm/s   Systemic Diam: 2.00 cm MV Decel Time: 266 msec MV E velocity: 68.10 cm/s MV A velocity: 65.60 cm/s MV E/A ratio:  1.04 Kathlyn Sacramento MD Electronically signed by Kathlyn Sacramento MD Signature Date/Time: 08/10/2021/1:43:15 PM    Final     Microbiology: Results for orders placed or performed during the hospital encounter of 08/09/21  Resp Panel by RT-PCR (Flu A&B, Covid) Nasopharyngeal Swab     Status: None   Collection Time: 08/09/21  2:13 PM   Specimen: Nasopharyngeal Swab; Nasopharyngeal(NP) swabs in vial transport medium  Result Value Ref Range Status   SARS Coronavirus 2 by RT PCR NEGATIVE NEGATIVE Final    Comment: (NOTE) SARS-CoV-2 target nucleic acids are NOT DETECTED.  The SARS-CoV-2 RNA is generally detectable in upper respiratory specimens during the acute phase of infection. The lowest concentration of SARS-CoV-2 viral copies this assay can detect is 138 copies/mL. A negative result does not preclude SARS-Cov-2 infection and should not be used as the sole basis for treatment or other patient management decisions. A negative result may occur with  improper specimen collection/handling, submission of specimen other than nasopharyngeal swab, presence of viral mutation(s) within the areas targeted by this assay, and inadequate number of viral copies(<138 copies/mL). A negative result must be combined with clinical observations, patient history, and epidemiological information. The expected result is Negative.  Fact Sheet for Patients:  EntrepreneurPulse.com.au  Fact Sheet for Healthcare Providers:  IncredibleEmployment.be  This test is no t yet approved or cleared by the [place] FDA and  has been authorized for detection and/or diagnosis of SARS-CoV-2 by FDA under an Emergency Use Authorization (EUA). This EUA will remain  in effect (meaning this test can be used) for the duration of the COVID-19 declaration under Section 564(b)(1) of the Act, 21 U.S.C.section 360bbb-3(b)(1), unless the authorization is terminated  or revoked sooner.       Influenza A by PCR  NEGATIVE NEGATIVE Final   Influenza B by PCR NEGATIVE NEGATIVE Final    Comment: (NOTE) The Xpert Xpress SARS-CoV-2/FLU/RSV plus assay is intended as an aid in the diagnosis of influenza from Nasopharyngeal swab specimens and should not be used as a sole basis for treatment. Nasal washings and aspirates are unacceptable for Xpert Xpress SARS-CoV-2/FLU/RSV testing.  Fact Sheet for Patients: EntrepreneurPulse.com.au  Fact Sheet for Healthcare Providers: IncredibleEmployment.be  This test is not yet approved or cleared by the [place] FDA and has been authorized for detection and/or diagnosis of SARS-CoV-2 by FDA under an Emergency Use Authorization (EUA). This EUA will remain in effect (meaning this test can be used) for the duration of the COVID-19 declaration under Section 564(b)(1) of the Act, 21 U.S.C. section 360bbb-3(b)(1), unless the authorization is terminated or revoked.  Performed at [facility], [address] [place], [place] [address]     Labs: CBC: Recent Labs  Lab 08/09/21 1343 08/10/21 0631  WBC 6.7 7.6  NEUTROABS 3.2  --   HGB 14.6 13.3  HCT 42.4 38.7*  MCV 86.7 87.6  PLT 297 [id number]   Basic Metabolic Panel: Recent Labs  Lab 08/09/21 1343 08/09/21 1647 08/10/21 0631  NA 138  --  139  K 3.4*  --  3.9  CL 105  --  108  CO2 21*  --  25  GLUCOSE 121*  --  100*  BUN 16  --  14  CREATININE 1.12  --  1.07  CALCIUM 9.0  --  8.8*  MG  --  2.1  --    Liver Function Tests: Recent Labs  Lab 08/09/21 1343  AST 36  ALT 27  ALKPHOS 53  BILITOT 0.7  PROT 7.8  ALBUMIN 4.5   CBG: Recent Labs  Lab 08/09/21 1351  GLUCAP 112*    Discharge time spent: greater than 30 minutes.  Signed: Patrecia Pour, MD [facility] 08/11/2021

## 2021-08-11 NOTE — Progress Notes (Signed)
Subjective: Doing well today. No complaints.   Objective: Current vital signs: BP 118/72 (BP Location: Right Arm)    Pulse (!) 50    Temp 98 F (36.7 C) (Oral)    Resp 18    Ht 5' 5"  (1.651 m)    Wt 77.1 kg    SpO2 97%    BMI 28.29 kg/m  Vital signs in last 24 hours: Temp:  [98 F (36.7 C)-98.6 F (37 C)] 98 F (36.7 C) (03/07 0402) Pulse Rate:  [49-80] 50 (03/07 0402) Resp:  [15-20] 18 (03/07 0402) BP: (109-120)/(69-80) 118/72 (03/07 0402) SpO2:  [96 %-100 %] 97 % (03/07 0402)  Intake/Output from previous day: 03/06 0701 - 03/07 0700 In: 120 [P.O.:120] Out: -  Intake/Output this shift: No intake/output data recorded. Nutritional status:  Diet Order             Diet Heart Room service appropriate? Yes; Fluid consistency: Thin  Diet effective now                  HEENT: [place]/AT Lungs: Respirations unlabored  Neurologic Exam: Ment: Alert and oriented. Speech normal. Cognition normal. Bright affect. No agitation noted.  CN: EOMI. Face symmetric. Phonation intact.  Motor: Normal spontaneous and goal directed movements x 4 without asymmetry.  Coordination. No ataxia noted.  Gait: Normal gait and station.   Lab Results: Results for orders placed or performed during the hospital encounter of 08/09/21 (from the past 48 hour(s))  Comprehensive metabolic panel     Status: Abnormal   Collection Time: 08/09/21  1:43 PM  Result Value Ref Range   Sodium 138 135 - 145 mmol/L   Potassium 3.4 (L) 3.5 - 5.1 mmol/L   Chloride 105 98 - 111 mmol/L   CO2 21 (L) 22 - 32 mmol/L   Glucose, Bld 121 (H) 70 - 99 mg/dL    Comment: Glucose reference range applies only to samples taken after fasting for at least 8 hours.   BUN 16 6 - 20 mg/dL   Creatinine, Ser 1.12 0.61 - 1.24 mg/dL   Calcium 9.0 8.9 - 10.3 mg/dL   Total Protein 7.8 6.5 - 8.1 g/dL   Albumin 4.5 3.5 - 5.0 g/dL   AST 36 15 - 41 U/L   ALT 27 0 - 44 U/L   Alkaline Phosphatase 53 38 - 126 U/L   Total Bilirubin 0.7 0.3 -  1.2 mg/dL   GFR, Estimated >60 >60 mL/min    Comment: (NOTE) Calculated using the CKD-EPI Creatinine Equation (2021)    Anion gap 12 5 - 15    Comment: Performed at [facility], [address] [place], [place] [address]  CBC with Differential/Platelet     Status: None   Collection Time: 08/09/21  1:43 PM  Result Value Ref Range   WBC 6.7 4.0 - 10.5 K/uL   RBC 4.89 4.22 - 5.81 MIL/uL   Hemoglobin 14.6 13.0 - 17.0 g/dL   HCT 42.4 39.0 - 52.0 %   MCV 86.7 80.0 - 100.0 fL   MCH 29.9 26.0 - 34.0 pg   MCHC 34.4 30.0 - 36.0 g/dL   RDW 12.6 11.5 - 15.5 %   Platelets 297 150 - 400 K/uL   nRBC 0.0 0.0 - 0.2 %   Neutrophils Relative % 47 %   Neutro Abs 3.2 1.7 - 7.7 K/uL   Lymphocytes Relative 38 %   Lymphs Abs 2.6 0.7 - 4.0 K/uL   Monocytes Relative 7 %  Monocytes Absolute 0.4 0.1 - 1.0 K/uL   Eosinophils Relative 6 %   Eosinophils Absolute 0.4 0.0 - 0.5 K/uL   Basophils Relative 1 %   Basophils Absolute 0.1 0.0 - 0.1 K/uL   Immature Granulocytes 1 %   Abs Immature Granulocytes 0.06 0.00 - 0.07 K/uL    Comment: Performed at [facility], [address] [place], [place] [address]  CBG monitoring, ED     Status: Abnormal   Collection Time: 08/09/21  1:51 PM  Result Value Ref Range   Glucose-Capillary 112 (H) 70 - 99 mg/dL    Comment: Glucose reference range applies only to samples taken after fasting for at least 8 hours.  Resp Panel by RT-PCR (Flu A&B, Covid) Nasopharyngeal Swab     Status: None   Collection Time: 08/09/21  2:13 PM   Specimen: Nasopharyngeal Swab; Nasopharyngeal(NP) swabs in vial transport medium  Result Value Ref Range   SARS Coronavirus 2 by RT PCR NEGATIVE NEGATIVE    Comment: (NOTE) SARS-CoV-2 target nucleic acids are NOT DETECTED.  The SARS-CoV-2 RNA is generally detectable in upper respiratory specimens during the acute phase of infection. The lowest concentration of SARS-CoV-2 viral copies this assay can detect is 138 copies/mL. A  negative result does not preclude SARS-Cov-2 infection and should not be used as the sole basis for treatment or other patient management decisions. A negative result may occur with  improper specimen collection/handling, submission of specimen other than nasopharyngeal swab, presence of viral mutation(s) within the areas targeted by this assay, and inadequate number of viral copies(<138 copies/mL). A negative result must be combined with clinical observations, patient history, and epidemiological information. The expected result is Negative.  Fact Sheet for Patients:  EntrepreneurPulse.com.au  Fact Sheet for Healthcare Providers:  IncredibleEmployment.be  This test is no t yet approved or cleared by the [place] FDA and  has been authorized for detection and/or diagnosis of SARS-CoV-2 by FDA under an Emergency Use Authorization (EUA). This EUA will remain  in effect (meaning this test can be used) for the duration of the COVID-19 declaration under Section 564(b)(1) of the Act, 21 U.S.C.section 360bbb-3(b)(1), unless the authorization is terminated  or revoked sooner.       Influenza A by PCR NEGATIVE NEGATIVE   Influenza B by PCR NEGATIVE NEGATIVE    Comment: (NOTE) The Xpert Xpress SARS-CoV-2/FLU/RSV plus assay is intended as an aid in the diagnosis of influenza from Nasopharyngeal swab specimens and should not be used as a sole basis for treatment. Nasal washings and aspirates are unacceptable for Xpert Xpress SARS-CoV-2/FLU/RSV testing.  Fact Sheet for Patients: EntrepreneurPulse.com.au  Fact Sheet for Healthcare Providers: IncredibleEmployment.be  This test is not yet approved or cleared by the [place] FDA and has been authorized for detection and/or diagnosis of SARS-CoV-2 by FDA under an Emergency Use Authorization (EUA). This EUA will remain in effect (meaning this test can be used)  for the duration of the COVID-19 declaration under Section 564(b)(1) of the Act, 21 U.S.C. section 360bbb-3(b)(1), unless the authorization is terminated or revoked.  Performed at [facility], [address] [place], [place] [address]   HIV Antibody (routine testing w rflx)     Status: None   Collection Time: 08/09/21  4:47 PM  Result Value Ref Range   HIV Screen 4th Generation wRfx Non Reactive Non Reactive    Comment: Performed at [facility], [place] [address], [place], [place] [address]  TSH  Status: None   Collection Time: 08/09/21  4:47 PM  Result Value Ref Range   TSH 2.577 0.350 - 4.500 uIU/mL    Comment: Performed by a 3rd Generation assay with a functional sensitivity of <=0.01 uIU/mL. Performed at [facility], [address] [place], [place] [address]   Magnesium     Status: None   Collection Time: 08/09/21  4:47 PM  Result Value Ref Range   Magnesium 2.1 1.7 - 2.4 mg/dL    Comment: Performed at [facility], [address] [place], [place] [address]  CBC     Status: Abnormal   Collection Time: 08/10/21  6:31 AM  Result Value Ref Range   WBC 7.6 4.0 - 10.5 K/uL   RBC 4.42 4.22 - 5.81 MIL/uL   Hemoglobin 13.3 13.0 - 17.0 g/dL   HCT 38.7 (L) 39.0 - 52.0 %   MCV 87.6 80.0 - 100.0 fL   MCH 30.1 26.0 - 34.0 pg   MCHC 34.4 30.0 - 36.0 g/dL   RDW 13.0 11.5 - 15.5 %   Platelets 236 150 - 400 K/uL   nRBC 0.0 0.0 - 0.2 %    Comment: Performed at [facility], [address], [place], [place] [address]  Basic metabolic panel     Status: Abnormal   Collection Time: 08/10/21  6:31 AM  Result Value Ref Range   Sodium 139 135 - 145 mmol/L   Potassium 3.9 3.5 - 5.1 mmol/L   Chloride 108 98 - 111 mmol/L   CO2 25 22 - 32 mmol/L   Glucose, Bld 100 (H) 70 - 99 mg/dL    Comment: Glucose reference range applies only to samples taken after fasting for at least 8 hours.   BUN 14 6 - 20 mg/dL   Creatinine, Ser 1.07 0.61 - 1.24  mg/dL   Calcium 8.8 (L) 8.9 - 10.3 mg/dL   GFR, Estimated >60 >60 mL/min    Comment: (NOTE) Calculated using the CKD-EPI Creatinine Equation (2021)    Anion gap 6 5 - 15    Comment: Performed at [facility], [address], [place],  [address]    Recent Results (from the past 240 hour(s))  Resp Panel by RT-PCR (Flu A&B, Covid) Nasopharyngeal Swab     Status: None   Collection Time: 08/09/21  2:13 PM   Specimen: Nasopharyngeal Swab; Nasopharyngeal(NP) swabs in vial transport medium  Result Value Ref Range Status   SARS Coronavirus 2 by RT PCR NEGATIVE NEGATIVE Final    Comment: (NOTE) SARS-CoV-2 target nucleic acids are NOT DETECTED.  The SARS-CoV-2 RNA is generally detectable in upper respiratory specimens during the acute phase of infection. The lowest concentration of SARS-CoV-2 viral copies this assay can detect is 138 copies/mL. A negative result does not preclude SARS-Cov-2 infection and should not be used as the sole basis for treatment or other patient management decisions. A negative result may occur with  improper specimen collection/handling, submission of specimen other than nasopharyngeal swab, presence of viral mutation(s) within the areas targeted by this assay, and inadequate number of viral copies(<138 copies/mL). A negative result must be combined with clinical observations, patient history, and epidemiological information. The expected result is Negative.  Fact Sheet for Patients:  EntrepreneurPulse.com.au  Fact Sheet for Healthcare Providers:  IncredibleEmployment.be  This test is no t yet approved or cleared by the [place] FDA and  has been authorized for detection and/or diagnosis of SARS-CoV-2 by FDA under an Emergency  Use Authorization (EUA). This EUA will remain  in effect (meaning this test can be used) for the duration of the COVID-19 declaration under Section 564(b)(1) of the Act,  21 U.S.C.section 360bbb-3(b)(1), unless the authorization is terminated  or revoked sooner.       Influenza A by PCR NEGATIVE NEGATIVE Final   Influenza B by PCR NEGATIVE NEGATIVE Final    Comment: (NOTE) The Xpert Xpress SARS-CoV-2/FLU/RSV plus assay is intended as an aid in the diagnosis of influenza from Nasopharyngeal swab specimens and should not be used as a sole basis for treatment. Nasal washings and aspirates are unacceptable for Xpert Xpress SARS-CoV-2/FLU/RSV testing.  Fact Sheet for Patients: EntrepreneurPulse.com.au  Fact Sheet for Healthcare Providers: IncredibleEmployment.be  This test is not yet approved or cleared by the [place] FDA and has been authorized for detection and/or diagnosis of SARS-CoV-2 by FDA under an Emergency Use Authorization (EUA). This EUA will remain in effect (meaning this test can be used) for the duration of the COVID-19 declaration under Section 564(b)(1) of the Act, 21 U.S.C. section 360bbb-3(b)(1), unless the authorization is terminated or revoked.  Performed at [facility], [address] [place], [place] [address]     Lipid Panel No results for input(s): CHOL, TRIG, HDL, CHOLHDL, VLDL, LDLCALC in the last 72 hours.  Studies/Results: CT ANGIO HEAD NECK W WO CM  Result Date: 08/10/2021 CLINICAL DATA:  56 year old male with syncope,  seizure activity. EXAM: CT ANGIOGRAPHY HEAD AND NECK TECHNIQUE: Multidetector CT imaging of the head and neck was performed using the standard protocol during bolus administration of intravenous contrast. Multiplanar CT image reconstructions and MIPs were obtained to evaluate the vascular anatomy. Carotid stenosis measurements (when applicable) are obtained utilizing NASCET criteria, using the distal internal carotid diameter as the denominator. RADIATION DOSE REDUCTION: This exam was performed according to the departmental dose-optimization program  which includes automated exposure control, adjustment of the mA and/or kV according to patient size and/or use of iterative reconstruction technique. CONTRAST:  9m OMNIPAQUE IOHEXOL 350 MG/ML SOLN COMPARISON:  Brain MRI 08/09/2021 and this morning. Head CT 08/09/2021. FINDINGS: CT HEAD Brain: Stable non contrast CT appearance of the brain. Left lentiform perivascular space. Calvarium and skull base: Negative. Paranasal sinuses: Visualized paranasal sinuses and mastoids are stable and well aerated. Orbits: Visualized orbits and scalp soft tissues are within normal limits. CTA NECK Skeleton: Negative. Upper chest: Minor upper lung atelectasis. Other neck: Mild motion artifact at the hypopharynx. Otherwise negative. Aortic arch: 4 vessel arch configuration, left vertebral artery arises directly from the arch. No arch atherosclerosis. Right carotid system: Negative. Left carotid system: Negative. Vertebral arteries: Negative; the right vertebral artery is dominant and the left arises directly from the aortic arch. CTA HEAD Posterior circulation: Negative posterior vertebral arteries and vertebrobasilar junction, mildly dominant right V4. Patent PICA origins. Patent basilar artery without stenosis. Normal SCA and PCA origins. Posterior communicating arteries are diminutive or absent. Bilateral PCA branches are within normal limits. Anterior circulation: Right ICA siphons are patent and normal. Normal ophthalmic artery origins. Patent and normal carotid termini, MCA and ACA origins. Normal anterior communicating artery. Bilateral ACA branches are within normal limits. Right MCA M1 segment, bifurcation, and right MCA branches are within normal limits. The left MCA bifurcates directly off the ICA terminus (series 14, image 24, "duplicated" M1 appearance). Left MCA branches are within normal limits. Venous sinuses: Patent. Anatomic variants: Non dominant left vertebral artery arises directly from the aortic arch. Left MCA  bifurcation  directly from the left ICA terminus. Review of the MIP images confirms the above findings IMPRESSION: 1. Negative CTA head and neck. Several incidental normal anatomic variations. 2. Stable and negative noncontrast CT appearance of the brain. Electronically Signed   By: Genevie Ann M.D.   On: 08/10/2021 07:27   CT Head Wo Contrast  Result Date: 08/09/2021 CLINICAL DATA:  Seizure, new onset. EXAM: CT HEAD WITHOUT CONTRAST CT CERVICAL SPINE WITHOUT CONTRAST TECHNIQUE: Multidetector CT imaging of the head and cervical spine was performed following the standard protocol without intravenous contrast. Multiplanar CT image reconstructions of the cervical spine were also generated. RADIATION DOSE REDUCTION: This exam was performed according to the departmental dose-optimization program which includes automated exposure control, adjustment of the mA and/or kV according to patient size and/or use of iterative reconstruction technique. COMPARISON:  None. FINDINGS: CT HEAD FINDINGS Brain: No evidence of acute infarction, hemorrhage, hydrocephalus, extra-axial collection or mass lesion/mass effect. Vascular: No hyperdense vessel or unexpected calcification. Skull: Normal. Negative for fracture or focal lesion. Sinuses/Orbits: Visualized globes and orbits are unremarkable. Visualized sinuses are clear. Other: None. CT CERVICAL SPINE FINDINGS Alignment: Normal. Skull base and vertebrae: No acute fracture. No primary bone lesion or focal pathologic process. Soft tissues and spinal canal: No prevertebral fluid or swelling. No visible canal hematoma. Disc levels: Mild loss of disc height at C4-C5. Minor loss of disc height at C5-C6 and C6-C7. No significant disc bulging. No evidence of a disc herniation. No stenosis. Upper chest: Negative. Other: None. IMPRESSION: HEAD CT 1. Normal. CERVICAL CT 1. No fracture or acute finding. Electronically Signed   By: Lajean Manes M.D.   On: 08/09/2021 14:40   CT Cervical Spine Wo  Contrast  Result Date: 08/09/2021 CLINICAL DATA:  Seizure, new onset. EXAM: CT HEAD WITHOUT CONTRAST CT CERVICAL SPINE WITHOUT CONTRAST TECHNIQUE: Multidetector CT imaging of the head and cervical spine was performed following the standard protocol without intravenous contrast. Multiplanar CT image reconstructions of the cervical spine were also generated. RADIATION DOSE REDUCTION: This exam was performed according to the departmental dose-optimization program which includes automated exposure control, adjustment of the mA and/or kV according to patient size and/or use of iterative reconstruction technique. COMPARISON:  None. FINDINGS: CT HEAD FINDINGS Brain: No evidence of acute infarction, hemorrhage, hydrocephalus, extra-axial collection or mass lesion/mass effect. Vascular: No hyperdense vessel or unexpected calcification. Skull: Normal. Negative for fracture or focal lesion. Sinuses/Orbits: Visualized globes and orbits are unremarkable. Visualized sinuses are clear. Other: None. CT CERVICAL SPINE FINDINGS Alignment: Normal. Skull base and vertebrae: No acute fracture. No primary bone lesion or focal pathologic process. Soft tissues and spinal canal: No prevertebral fluid or swelling. No visible canal hematoma. Disc levels: Mild loss of disc height at C4-C5. Minor loss of disc height at C5-C6 and C6-C7. No significant disc bulging. No evidence of a disc herniation. No stenosis. Upper chest: Negative. Other: None. IMPRESSION: HEAD CT 1. Normal. CERVICAL CT 1. No fracture or acute finding. Electronically Signed   By: Lajean Manes M.D.   On: 08/09/2021 14:40   MR BRAIN WO CONTRAST  Result Date: 08/09/2021 CLINICAL DATA:  Seizure, new onset EXAM: MRI HEAD WITHOUT CONTRAST TECHNIQUE: Multiplanar, multiecho pulse sequences of the brain and surrounding structures were obtained without intravenous contrast. COMPARISON:  Prior MRI, correlation is made with CT head 10/09/2021 FINDINGS: Brain: No restricted diffusion  to suggest acute or subacute infarct. No acute hemorrhage mass, mass effect, or midline shift. Dilated perivascular spaces in the bilateral  basal ganglia. No hydrocephalus or extra-axial collection. The hippocampi are symmetric in size and signal, although there may be slightly increased T2 signal in the hippocampi bilaterally. No heterotopia or cortical dysgenesis. Vascular: Normal flow voids. Skull and upper cervical spine: Normal marrow signal. Sinuses/Orbits: Negative. Other: The mastoids are well aerated. IMPRESSION: 1. No definite seizure etiology is seen; however, possible mildly increased T2 signal in the bilateral hippocampi may be related to recent seizure. 2. No other acute intracranial process. Electronically Signed   By: Merilyn Baba M.D.   On: 08/09/2021 19:49   MR BRAIN W CONTRAST  Result Date: 08/10/2021 CLINICAL DATA:  56 year old male with seizure activity. Questionable increased hippocampal T2 hyperintensity on noncontrast MRI yesterday. EXAM: MRI HEAD WITH CONTRAST TECHNIQUE: Multiplanar, multiecho pulse sequences of the brain and surrounding structures were obtained with intravenous contrast. CONTRAST:  53m GADAVIST GADOBUTROL 1 MMOL/ML IV SOLN COMPARISON:  MRI without contrast 08/09/2021. FINDINGS: Post-contrast axial and coronal T1 weighted imaging. Major dural venous sinuses are enhancing and appear to be patent. No midline shift, mass effect, or evidence of intracranial mass lesion. No ventriculomegaly. Stable visible scalp, face, orbits. No abnormal enhancement identified. No dural thickening. No hippocampal enhancement. IMPRESSION: Negative postcontrast MRI Brain. Electronically Signed   By: HGenevie AnnM.D.   On: 08/10/2021 06:52   ECHOCARDIOGRAM COMPLETE  Result Date: 08/10/2021    ECHOCARDIOGRAM REPORT   Patient Name:   John CROSSANDate of Exam: 08/10/2021 Medical Rec #:  [id number]   Height:       65.0 in Accession #:    [id number]  Weight:       170.0 lb Date of Birth:  [birth year]    BSA:          1.846 m Patient Age:    89years     BP:           121/68 mmHg Patient Gender: M            HR:           65 bpm. Exam Location:  [facility] Procedure: 2D Echo, Color Doppler and Cardiac Doppler Indications:     R55 Syncope  History:         Patient has no prior history of Echocardiogram examinations.                  Risk Factors:Hypertension and Dyslipidemia.  Sonographer:     JCharmayne SheerReferring Phys:  AWildomarDiagnosing Phys: MKathlyn SacramentoMD  Sonographer Comments: Suboptimal subcostal window. IMPRESSIONS  1. Left ventricular ejection fraction, by estimation, is 65 to 70%. The left ventricle has normal function. The left ventricle has no regional wall motion abnormalities. Left ventricular diastolic parameters were normal.  2. Right ventricular systolic function is normal. The right ventricular size is normal. There is normal pulmonary artery systolic pressure.  3. The mitral valve is normal in structure. Trivial mitral valve regurgitation. No evidence of mitral stenosis.  4. The aortic valve is normal in structure. Aortic valve regurgitation is not visualized. Aortic valve sclerosis is present, with no evidence of aortic valve stenosis.  5. The inferior vena cava is normal in size with greater than 50% respiratory variability, suggesting right atrial pressure of 3 mmHg. FINDINGS  Left Ventricle: Left ventricular ejection fraction, by estimation, is 65 to 70%. The left ventricle has normal function. The left ventricle has no regional wall motion abnormalities. The left ventricular internal cavity size was normal in  size. There is  borderline left ventricular hypertrophy. Left ventricular diastolic parameters were normal. Right Ventricle: The right ventricular size is normal. No increase in right ventricular wall thickness. Right ventricular systolic function is normal. There is normal pulmonary artery systolic pressure. The tricuspid regurgitant velocity is 2.26 m/s, and  with an  assumed right atrial pressure of 3 mmHg, the estimated right ventricular systolic pressure is 41.9 mmHg. Left Atrium: Left atrial size was normal in size. Right Atrium: Right atrial size was normal in size. Pericardium: There is no evidence of pericardial effusion. Mitral Valve: The mitral valve is normal in structure. Trivial mitral valve regurgitation. No evidence of mitral valve stenosis. MV peak gradient, 2.7 mmHg. The mean mitral valve gradient is 1.0 mmHg. Tricuspid Valve: The tricuspid valve is normal in structure. Tricuspid valve regurgitation is trivial. No evidence of tricuspid stenosis. Aortic Valve: The aortic valve is normal in structure. Aortic valve regurgitation is not visualized. Aortic valve sclerosis is present, with no evidence of aortic valve stenosis. Aortic valve mean gradient measures 3.0 mmHg. Aortic valve peak gradient measures 4.8 mmHg. Aortic valve area, by VTI measures 2.60 cm. Pulmonic Valve: The pulmonic valve was normal in structure. Pulmonic valve regurgitation is not visualized. No evidence of pulmonic stenosis. Aorta: The aortic root is normal in size and structure. Venous: The inferior vena cava is normal in size with greater than 50% respiratory variability, suggesting right atrial pressure of 3 mmHg. IAS/Shunts: No atrial level shunt detected by color flow Doppler.  LEFT VENTRICLE PLAX 2D LVIDd:         4.34 cm   Diastology LVIDs:         2.34 cm   LV e' medial:    8.05 cm/s LV PW:         1.13 cm   LV E/e' medial:  8.5 LV IVS:        0.79 cm   LV e' lateral:   10.60 cm/s LVOT diam:     2.00 cm   LV E/e' lateral: 6.4 LV SV:         53 LV SV Index:   29 LVOT Area:     3.14 cm  RIGHT VENTRICLE RV Basal diam:  4.02 cm LEFT ATRIUM             Index        RIGHT ATRIUM           Index LA diam:        3.80 cm 2.06 cm/m   RA Area:     10.50 cm LA Vol (A2C):   31.2 ml 16.90 ml/m  RA Volume:   18.80 ml  10.18 ml/m LA Vol (A4C):   33.6 ml 18.20 ml/m LA Biplane Vol: 34.1 ml 18.47  ml/m  AORTIC VALVE                    PULMONIC VALVE AV Area (Vmax):    2.48 cm     PV Vmax:       1.00 m/s AV Area (Vmean):   2.34 cm     PV Vmean:      69.900 cm/s AV Area (VTI):     2.60 cm     PV VTI:        0.188 m AV Vmax:           110.00 cm/s  PV Peak grad:  4.0 mmHg AV Vmean:  73.700 cm/s  PV Mean grad:  2.0 mmHg AV VTI:            0.204 m AV Peak Grad:      4.8 mmHg AV Mean Grad:      3.0 mmHg LVOT Vmax:         86.80 cm/s LVOT Vmean:        54.900 cm/s LVOT VTI:          0.169 m LVOT/AV VTI ratio: 0.83  AORTA Ao Root diam: 3.40 cm MITRAL VALVE               TRICUSPID VALVE MV Area (PHT): 2.85 cm    TR Peak grad:   20.4 mmHg MV Area VTI:   2.17 cm    TR Vmax:        226.00 cm/s MV Peak grad:  2.7 mmHg MV Mean grad:  1.0 mmHg    SHUNTS MV Vmax:       0.82 m/s    Systemic VTI:  0.17 m MV Vmean:      51.6 cm/s   Systemic Diam: 2.00 cm MV Decel Time: 266 msec MV E velocity: 68.10 cm/s MV A velocity: 65.60 cm/s MV E/A ratio:  1.04 Kathlyn Sacramento MD Electronically signed by Kathlyn Sacramento MD Signature Date/Time: 08/10/2021/1:43:15 PM    Final     Medications: Scheduled:  chlorhexidine gluconate (MEDLINE KIT)  15 mL Mouth Rinse BID   mouth rinse  15 mL Mouth Rinse 10 times per day    Assessment: 56 year old male with a PMHx of HTN, hyperlipidemia, no personal history of seizure who presented after a convulsive event. Patient was shopping for shoes, looked up at a high shelf and began to feel lightheaded; he then fell to the floor, convulsed for several minutes and bit his tongue, after which he was combative, then lethargic.  - Given length of convulsions, tongue biting, and post-ictal combativeness/lethargy/disorientation favor seizure. Unlikely to have been convulsive syncope. - MRI brain: No definite seizure etiology is seen; however, official Radiology report documents that "possible mildly increased T2 signal in the bilateral hippocampi may be related to recent seizure." On personal  review of the images by Neurohospitalist, there does not appear to be any abnormal hippocampal signal, and interpretation by Radiology is felt to be a false positive. No abnormal enhancement on follow up MRI with contrast.  - Negative CTA head and neck. Several incidental normal anatomic variations. Given the negative CTA, vertebrobasilar insufficiency is unlikely to be the etiology for the patient's loss of consciousness.  - TTE: Unremarkable.  - EEG: This study is within normal limits. No seizures or epileptiform discharges were seen throughout the recording.  Recommendations: - Recent AAN guidelines state the following regarding first time seizures with or without associated imaging or EEG abnormality: "Adults with an unprovoked first seizure should be informed that their seizure recurrence risk is greatest early within the first 2 years (21%-45%) (Level A), and clinical variables associated with increased risk may include a prior brain insult (Level A), an EEG with epileptiform abnormalities (Level A), a significant brain-imaging abnormality (Level B), and a nocturnal seizure (Level B). Immediate antiepileptic drug (AED) therapy, as compared with delay of treatment pending a second seizure, is likely to reduce recurrence risk within the first 2 years (Level B) but may not improve quality of life (Level C). Over a longer term (>3 years), immediate AED treatment is unlikely to improve prognosis as measured by sustained seizure  remission (Level B). Patients should be advised that risk of AED adverse events (AEs) may range from 7% to 31% (Level B) and that these AEs are likely predominantly mild and reversible. Clinicians' recommendations whether to initiate immediate AED treatment after a first seizure should be based on individualized assessments that weigh the risk of recurrence against the AEs of AED therapy, consider educated patient preferences, and advise that immediate treatment will not improve the  long-term prognosis for seizure remission but will reduce seizure risk over the subsequent 2 years." - Risks/benefits of starting an anticonvulsant have been discussed with the patient. Best initial option is Keppra and specific side effects of this medication have been discussed with the patient, who elects to start this medication based on the above guidelines. Will start Keppra at 500 mg po BID. Consideration for tapering off this medication should be made in 2 years if he has been seizure free. All questions answered.  - Outpatient follow up with [facility] in 2-4 weeks.  - Seizure precautions have been discussed with the patient and his family: Per [facility] statutes, patients with seizures are not allowed to drive until  they have been seizure-free for six months. Use caution when using heavy equipment or power tools. Avoid working on ladders or at heights. Take showers instead of baths. Ensure the water temperature is not too high on the home water heater. Do not go swimming alone. When caring for infants or small children, sit down when holding, feeding, or changing them to minimize risk of injury to the child in the event you have a seizure. Also, Maintain good sleep hygiene. Avoid alcohol.    LOS: 0 days   @Electronically  signed: Dr. Kerney Elbe 08/11/2021  7:56 AM

## 2021-08-11 NOTE — Procedures (Signed)
Patient Name: John Galvan  ?MRN: [id number]  ?Epilepsy Attending: Charlsie Quest  ?Referring Physician/Provider: Lucile Shutters, MD ?Date: 08/10/2021 ?Duration: 33.07 mins ? ?Patient history: 56 yo gentleman with pmhx HTN, HL, no personal hx seizure who presented after convulsive event. Patient was shopping for shoes, looked up at a high shelf and began to feel lightheaded.  EEG to evaluate for seizures. ? ?Level of alertness: Awake, asleep ? ?AEDs during EEG study: None ? ?Technical aspects: This EEG study was done with scalp electrodes positioned according to the 10-20 International system of electrode placement. Electrical activity was acquired at a sampling rate of 500Hz  and reviewed with a high frequency filter of 70Hz  and a low frequency filter of 1Hz . EEG data were recorded continuously and digitally stored.  ? ?Description: The posterior dominant rhythm consists of 9 Hz activity of moderate voltage (25-35 uV) seen predominantly in posterior head regions, symmetric and reactive to eye opening and eye closing. Sleep was characterized by vertex waves, sleep spindles (12 to 14 Hz), maximal frontocentral region. Physiologic photic driving was not seen during photic stimulation.  Hyperventilation was not performed.    ? ?IMPRESSION: ?This study is within normal limits. No seizures or epileptiform discharges were seen throughout the recording. ? ?  ? ?

## 2021-09-01 ENCOUNTER — Encounter: Payer: Self-pay | Admitting: Neurology

## 2021-09-01 ENCOUNTER — Ambulatory Visit: Payer: Managed Care, Other (non HMO) | Admitting: Neurology

## 2021-09-01 VITALS — BP 128/86 | HR 93 | Ht 65.0 in | Wt 180.5 lb

## 2021-09-01 DIAGNOSIS — Z5181 Encounter for therapeutic drug level monitoring: Secondary | ICD-10-CM

## 2021-09-01 DIAGNOSIS — G40909 Epilepsy, unspecified, not intractable, without status epilepticus: Secondary | ICD-10-CM

## 2021-09-01 DIAGNOSIS — E785 Hyperlipidemia, unspecified: Secondary | ICD-10-CM | POA: Insufficient documentation

## 2021-09-01 MED ORDER — LEVETIRACETAM 500 MG PO TABS: 500.0000 mg | ORAL_TABLET | Freq: Two times a day (BID) | ORAL | 4 refills | Status: DC

## 2021-09-01 NOTE — Progress Notes (Signed)
? ?[facility] ? ?PATIENT: John Galvan ?DOB: Aug 31, 1965 ? ?REQUESTING CLINICIAN: Tyrone Nine, MD ?HISTORY FROM: Patient and wife  ?REASON FOR VISIT: New onset seizure  ? ? ?HISTORICAL ? ?CHIEF COMPLAINT:  ?Chief Complaint  ?Patient presents with  ? New Patient (Initial Visit)  ?  Rm 13. Accompanied by wife, Bonita Quin. ?NP Internal referral for seizures.  ? ? ?HISTORY OF PRESENT ILLNESS:  ?This is a 56 year old gentleman past medical history of hyperlipidemia who is presenting after a seizure.  Patient reports on March 5, he was shopping at the outlet then at one point he felt lightheaded and next thing that he remember is waking up in the ambulance then the next thing is waking up in the ED.  Wife reported patient fell to the floor, eyes was opened and he was shaking.  The entire episode lasted several minutes.  EMS was called.  He did bit his tongue and possibly had urinary incontinence.  In the ED patient had a brain MRI which did not show any acute intracranial abnormality, his EEG was negative also.  On discharge he was started on Keppra 500 mg twice daily.  He reports compliance with medication, denies any side effect from the medication.  ?He denies any family history of seizures, denies any seizure risk factors other than concussion that he sustained as a teenager and no other risk factors identified.   ? ? ?Handedness: Left handed  ? ?Onset: March 5 ? ?Seizure Type: focal to generalized vs. Generalized ? ?Current frequency: Only once  ? ?Any injuries from seizures: Tongue bite  ? ?Seizure risk factors: Concussion,  ? ?Previous ASMs: None  ? ?Currenty ASMs: Levetiracetam 500 mg BID  ? ?ASMs side effects: Denies  ? ?Brain Images: No acute abnormalities  ? ?Previous EEGs: Normal  ? ? ?OTHER MEDICAL CONDITIONS: Hyperlipidemia  ? ?REVIEW OF SYSTEMS: Full 14 system review of systems performed and negative with exception of: as noted in the PI  ? ?ALLERGIES: ?Allergies  ?Allergen Reactions  ?  Simvastatin   ?  Other reaction(s): muscle aches  ? ? ?HOME MEDICATIONS: ?Outpatient Medications Prior to Visit  ?Medication Sig Dispense Refill  ? Multiple Vitamins-Minerals (CENTRUM SILVER ADULT 50+ PO) Take 1 tablet by mouth daily.    ? pravastatin (PRAVACHOL) 20 MG tablet Take 20 mg by mouth daily.    ? levETIRAcetam (KEPPRA) 500 MG tablet Take 1 tablet (500 mg total) by mouth 2 (two) times daily. 60 tablet 1  ? ibuprofen (ADVIL) 200 MG tablet Take 200 mg by mouth every 6 (six) hours as needed.    ? ?No facility-administered medications prior to visit.  ? ? ?PAST MEDICAL HISTORY: ?Past Medical History:  ?Diagnosis Date  ? Hyperlipidemia   ? Hypertension   ? ? ?PAST SURGICAL HISTORY: ?History reviewed. No pertinent surgical history. ? ?FAMILY HISTORY: ?History reviewed. No pertinent family history. ? ?SOCIAL HISTORY: ?Social History  ? ?Socioeconomic History  ? Marital status: Married  ?  Spouse name: Not on file  ? Number of children: Not on file  ? Years of education: Not on file  ? Highest education level: Not on file  ?Occupational History  ? Not on file  ?Tobacco Use  ? Smoking status: Never  ? Smokeless tobacco: Never  ?Vaping Use  ? Vaping Use: Never used  ?Substance and Sexual Activity  ? Alcohol use: Never  ? Drug use: Never  ? Sexual activity: Not on file  ?Other Topics Concern  ?  Not on file  ?Social History Narrative  ? Not on file  ? ?Social Determinants of Health  ? ?Financial Resource Strain: Not on file  ?Food Insecurity: Not on file  ?Transportation Needs: Not on file  ?Physical Activity: Not on file  ?Stress: Not on file  ?Social Connections: Not on file  ?Intimate Partner Violence: Not on file  ? ? ? ?PHYSICAL EXAM ? ?GENERAL EXAM/CONSTITUTIONAL: ?Vitals:  ?Vitals:  ? 09/01/21 1510  ?BP: 128/86  ?Pulse: 93  ?Weight: 180 lb 8 oz (81.9 kg)  ?Height: 5\' 5"  (1.651 m)  ? ?Body mass index is 30.04 kg/m?. ?Wt Readings from Last 3 Encounters:  ?09/01/21 180 lb 8 oz (81.9 kg)  ?08/09/21 170 lb (77.1 kg)   ?01/20/20 180 lb (81.6 kg)  ? ?Patient is in no distress; well developed, nourished and groomed; neck is supple ? ?EYES: ?Pupils round and reactive to light, Visual fields full to confrontation, Extraocular movements intacts,  ?No results found. ? ?MUSCULOSKELETAL: ?Gait, strength, tone, movements noted in Neurologic exam below ? ?NEUROLOGIC: ?MENTAL STATUS:  ?   ? View : No data to display.  ?  ?  ?  ? ?awake, alert, oriented to person, place and time ?recent and remote memory intact ?normal attention and concentration ?language fluent, comprehension intact, naming intact ?fund of knowledge appropriate ? ?CRANIAL NERVE:  ?2nd, 3rd, 4th, 6th - pupils equal and reactive to light, visual fields full to confrontation, extraocular muscles intact, no nystagmus ?5th - facial sensation symmetric ?7th - facial strength symmetric ?8th - hearing intact ?9th - palate elevates symmetrically, uvula midline ?11th - shoulder shrug symmetric ?12th - tongue protrusion midline ? ?MOTOR:  ?normal bulk and tone, full strength in the BUE, BLE ? ?SENSORY:  ?normal and symmetric to light touch ? ?COORDINATION:  ?finger-nose-finger, fine finger movements normal ? ?REFLEXES:  ?deep tendon reflexes present and symmetric ? ?GAIT/STATION:  ?normal ? ? ?DIAGNOSTIC DATA (LABS, IMAGING, TESTING) ?- I reviewed patient records, labs, notes, testing and imaging myself where available. ? ?Lab Results  ?Component Value Date  ? WBC 7.6 08/10/2021  ? HGB 13.3 08/10/2021  ? HCT 38.7 (L) 08/10/2021  ? MCV 87.6 08/10/2021  ? PLT 236 08/10/2021  ? ?   ?Component Value Date/Time  ? NA 139 08/10/2021 0631  ? K 3.9 08/10/2021 0631  ? CL 108 08/10/2021 0631  ? CO2 25 08/10/2021 0631  ? GLUCOSE 100 (H) 08/10/2021 0631  ? BUN 14 08/10/2021 0631  ? CREATININE 1.07 08/10/2021 0631  ? CALCIUM 8.8 (L) 08/10/2021 0631  ? PROT 7.8 08/09/2021 1343  ? ALBUMIN 4.5 08/09/2021 1343  ? AST 36 08/09/2021 1343  ? ALT 27 08/09/2021 1343  ? ALKPHOS 53 08/09/2021 1343  ? BILITOT  0.7 08/09/2021 1343  ? GFRNONAA >60 08/10/2021 0631  ? ?No results found for: CHOL, HDL, LDLCALC, LDLDIRECT, TRIG ?No results found for: HGBA1C ?No results found for: VITAMINB12 ?Lab Results  ?Component Value Date  ? TSH 2.577 08/09/2021  ? ?MRI Brain with contrast 08/10/21 ?No abnormal enhancement identified. No dural thickening. No hippocampal enhancement ?Negative postcontrast MRI Brain. ? ? ?Routine EEG 08/11/21 ?This study is within normal limits. No seizures or epileptiform discharges were seen throughout the recording. ? ? ? ?ASSESSMENT AND PLAN ? ?56 y.o. year old male  with history of hyperlipidemia who is presenting after his first lifetime seizure.  Patient denies any provoking factor.  He is currently on Keppra 500 mg twice daily denies any side  effect from the medication, and reports compliance.  I will check the Keppra level today and will continue patient on Keppra 500 mg twice daily.  Advised him to contact me if he has a breakthrough seizure otherwise I will see him in 6 months for follow-up.  We also discussed driving this restriction for the next 6 months.  He voices understanding. ? ? ?1. Seizure disorder (HCC)   ? ? ?Patient Instructions  ?Continue with Keppra 500 mg twice daily  ?Will obtain a Keppra level today  ?Follow up in 6 months or sooner if worse  ? ? ?Per [facility] statutes, patients with seizures are not allowed to drive until they have been seizure-free for six months.  Other recommendations include using caution when using heavy equipment or power tools. Avoid working on ladders or at heights. Take showers instead of baths.  Do not swim alone.  Ensure the water temperature is not too high on the home water heater. Do not go swimming alone. Do not lock yourself in a room alone (i.e. bathroom). When caring for infants or small children, sit down when holding, feeding, or changing them to minimize risk of injury to the child in the event you have a seizure. Maintain good sleep  hygiene. Avoid alcohol.  Also recommend adequate sleep, hydration, good diet and minimize stress. ? ? ?During the Seizure ? ?- First, ensure adequate ventilation and place patients on the floor on their le

## 2021-09-01 NOTE — Patient Instructions (Signed)
Continue with Keppra 500 mg twice daily  ?Will obtain a Keppra level today  ?Follow up in 6 months or sooner if worse  ? ? ?Per [facility] statutes, patients with seizures are not allowed to drive until they have been seizure-free for six months.  Other recommendations include using caution when using heavy equipment or power tools. Avoid working on ladders or at heights. Take showers instead of baths.  Do not swim alone.  Ensure the water temperature is not too high on the home water heater. Do not go swimming alone. Do not lock yourself in a room alone (i.e. bathroom). When caring for infants or small children, sit down when holding, feeding, or changing them to minimize risk of injury to the child in the event you have a seizure. Maintain good sleep hygiene. Avoid alcohol.  Also recommend adequate sleep, hydration, good diet and minimize stress. ? ? ?During the Seizure ? ?- First, ensure adequate ventilation and place patients on the floor on their left side  ?Loosen clothing around the neck and ensure the airway is patent. If the patient is clenching the teeth, do not force the mouth open with any object as this can cause severe damage ?- Remove all items from the surrounding that can be hazardous. The patient may be oblivious to what's happening and may not even know what he or she is doing. ?If the patient is confused and wandering, either gently guide him/her away and block access to outside areas ?- Reassure the individual and be comforting ?- Call 911. In most cases, the seizure ends before EMS arrives. However, there are cases when seizures may last over 3 to 5 minutes. Or the individual may have developed breathing difficulties or severe injuries. If a pregnant patient or a person with diabetes develops a seizure, it is prudent to call an ambulance. ?- Finally, if the patient does not regain full consciousness, then call EMS. Most patients will remain confused for about 45 to 90 minutes after a  seizure, so you must use judgment in calling for help. ?- Avoid restraints but make sure the patient is in a bed with padded side rails ?- Place the individual in a lateral position with the neck slightly flexed; this will help the saliva drain from the mouth and prevent the tongue from falling backward ?- Remove all nearby furniture and other hazards from the area ?- Provide verbal assurance as the individual is regaining consciousness ?- Provide the patient with privacy if possible ?- Call for help and start treatment as ordered by the caregiver ? ? After the Seizure (Postictal Stage) ? ?After a seizure, most patients experience confusion, fatigue, muscle pain and/or a headache. Thus, one should permit the individual to sleep. For the next few days, reassurance is essential. Being calm and helping reorient the person is also of importance. ? ?Most seizures are painless and end spontaneously. Seizures are not harmful to others but can lead to complications such as stress on the lungs, brain and the heart. Individuals with prior lung problems may develop labored breathing and respiratory distress.   ?

## 2021-09-02 LAB — LEVETIRACETAM LEVEL: Levetiracetam Lvl: 12.6 ug/mL (ref 10.0–40.0)

## 2021-09-02 NOTE — Progress Notes (Signed)
Please call and advise the patient that the recent Keppra level was normal. No further action is required on these tests at this time. Please remind patient to keep any upcoming appointments or tests and to call us with any interim questions, concerns, problems or updates. Thanks,  ° °Royer Cristobal, MD ° °

## 2021-09-03 ENCOUNTER — Telehealth: Payer: Self-pay

## 2021-09-03 NOTE — Telephone Encounter (Signed)
I called the pt and left a vm advising of results( ok per dpr) ? ?Pt advised to call back if he has any questions/concerns.  ?

## 2021-09-03 NOTE — Telephone Encounter (Signed)
-----   Message from Windell Norfolk, MD sent at 09/02/2021  4:47 PM EDT ----- ?Please call and advise the patient that the recent Keppra level was normal.  No further action is required on these tests at this time. Please remind patient to keep any upcoming appointments or tests and to call us with any interim questions, concerns, problems or updates. Thanks,  ? ?Windell Norfolk, MD ? ?  ? ?

## 2022-01-01 ENCOUNTER — Encounter: Payer: Self-pay | Admitting: Neurology

## 2022-02-01 ENCOUNTER — Encounter: Payer: Self-pay | Admitting: Neurology

## 2022-02-01 ENCOUNTER — Ambulatory Visit (INDEPENDENT_AMBULATORY_CARE_PROVIDER_SITE_OTHER): Payer: BC Managed Care – PPO | Admitting: Neurology

## 2022-02-01 VITALS — BP 155/88 | HR 64 | Ht 66.0 in | Wt 184.0 lb

## 2022-02-01 DIAGNOSIS — G40909 Epilepsy, unspecified, not intractable, without status epilepticus: Secondary | ICD-10-CM | POA: Diagnosis not present

## 2022-02-01 MED ORDER — LEVETIRACETAM 500 MG PO TABS: 500.0000 mg | ORAL_TABLET | Freq: Two times a day (BID) | ORAL | 2 refills | Status: AC

## 2022-02-01 NOTE — Progress Notes (Signed)
[facility]  PATIENT: John Galvan DOB: 1965-12-07  REQUESTING CLINICIAN: Joycelyn Rua, MD HISTORY FROM: Patient and wife  REASON FOR VISIT: New onset seizure    HISTORICAL  CHIEF COMPLAINT:  Chief Complaint  Patient presents with   Follow-up    Rm 15 alone here for 6 month f/u. Pt reports he has been doing well. No complaints.    INTERVAL HISTORY 02/01/2022:  Patient presents today for follow-up, at last visit in March plan was to continue with Keppra 500 mg twice daily.  Patient reports that he has been taking the Keppra 500 mg BID initially but caused daytime somnolence and fatigue therefore he self decreased it to 500 mg nightly.  Since then, he has been doing very well, denies any seizures, denies any side effect from the medication.  Currently does not have any concerns or questions in terms of seizures other than the fact that he was told today that his brother suffered heart attack [place].   HISTORY OF PRESENT ILLNESS:  This is a 56 year old gentleman past medical history of hyperlipidemia who is presenting after a seizure.  Patient reports on March 5, he was shopping at the outlet then at one point he felt lightheaded and next thing that he remember is waking up in the ambulance then the next thing is waking up in the ED.  Wife reported patient fell to the floor, eyes was opened and he was shaking.  The entire episode lasted several minutes.  EMS was called.  He did bit his tongue and possibly had urinary incontinence.  In the ED patient had a brain MRI which did not show any acute intracranial abnormality, his EEG was negative also.  On discharge he was started on Keppra 500 mg twice daily.  He reports compliance with medication, denies any side effect from the medication.  He denies any family history of seizures, denies any seizure risk factors other than concussion that he sustained as a teenager and no other risk factors identified.     Handedness:  Left handed   Onset: March 5  Seizure Type: focal to generalized vs. Generalized  Current frequency: Only once   Any injuries from seizures: Tongue bite   Seizure risk factors: Concussion,   Previous ASMs: None   Currenty ASMs: Levetiracetam 500 mg BID   ASMs side effects: Denies   Brain Images: No acute abnormalities   Previous EEGs: Normal    OTHER MEDICAL CONDITIONS: Hyperlipidemia   REVIEW OF SYSTEMS: Full 14 system review of systems performed and negative with exception of: as noted in the PI   ALLERGIES: Allergies  Allergen Reactions   Simvastatin     Other reaction(s): muscle aches    HOME MEDICATIONS: Outpatient Medications Prior to Visit  Medication Sig Dispense Refill   Multiple Vitamins-Minerals (CENTRUM SILVER ADULT 50+ PO) Take 1 tablet by mouth daily.     pravastatin (PRAVACHOL) 20 MG tablet Take 20 mg by mouth daily.     levETIRAcetam (KEPPRA) 500 MG tablet Take 1 tablet (500 mg total) by mouth 2 (two) times daily. 180 tablet 4   No facility-administered medications prior to visit.    PAST MEDICAL HISTORY: Past Medical History:  Diagnosis Date   Hyperlipidemia    Hypertension     PAST SURGICAL HISTORY: No past surgical history on file.  FAMILY HISTORY: No family history on file.  SOCIAL HISTORY: Social History   Socioeconomic History   Marital status: Married    Spouse name:  Not on file   Number of children: Not on file   Years of education: Not on file   Highest education level: Not on file  Occupational History   Not on file  Tobacco Use   Smoking status: Never   Smokeless tobacco: Never  Vaping Use   Vaping Use: Never used  Substance and Sexual Activity   Alcohol use: Never   Drug use: Never   Sexual activity: Not on file  Other Topics Concern   Not on file  Social History Narrative   Not on file   Social Determinants of Health   Financial Resource Strain: Not on file  Food Insecurity: Not on file  Transportation  Needs: Not on file  Physical Activity: Not on file  Stress: Not on file  Social Connections: Not on file  Intimate Partner Violence: Not on file     PHYSICAL EXAM  GENERAL EXAM/CONSTITUTIONAL: Vitals:  Vitals:   02/01/22 1149  BP: (!) 155/88  Pulse: 64  Weight: 184 lb (83.5 kg)  Height: 5\' 6"  (1.676 m)   Body mass index is 29.7 kg/m. Wt Readings from Last 3 Encounters:  02/01/22 184 lb (83.5 kg)  09/01/21 180 lb 8 oz (81.9 kg)  08/09/21 170 lb (77.1 kg)   Patient is in no distress; well developed, nourished and groomed; neck is supple  EYES: Pupils round and reactive to light, Visual fields full to confrontation, Extraocular movements intacts,  No results found.  MUSCULOSKELETAL: Gait, strength, tone, movements noted in Neurologic exam below  NEUROLOGIC: MENTAL STATUS:      No data to display         awake, alert, oriented to person, place and time recent and remote memory intact normal attention and concentration language fluent, comprehension intact, naming intact fund of knowledge appropriate  CRANIAL NERVE:  2nd, 3rd, 4th, 6th - pupils equal and reactive to light, visual fields full to confrontation, extraocular muscles intact, no nystagmus 5th - facial sensation symmetric 7th - facial strength symmetric 8th - hearing intact 9th - palate elevates symmetrically, uvula midline 11th - shoulder shrug symmetric 12th - tongue protrusion midline  MOTOR:  normal bulk and tone, full strength in the BUE, BLE  SENSORY:  normal and symmetric to light touch  COORDINATION:  finger-nose-finger, fine finger movements normal  REFLEXES:  deep tendon reflexes present and symmetric  GAIT/STATION:  normal   DIAGNOSTIC DATA (LABS, IMAGING, TESTING) - I reviewed patient records, labs, notes, testing and imaging myself where available.  Lab Results  Component Value Date   WBC 7.6 08/10/2021   HGB 13.3 08/10/2021   HCT 38.7 (L) 08/10/2021   MCV 87.6  08/10/2021   PLT 236 08/10/2021      Component Value Date/Time   NA 139 08/10/2021 0631   K 3.9 08/10/2021 0631   CL 108 08/10/2021 0631   CO2 25 08/10/2021 0631   GLUCOSE 100 (H) 08/10/2021 0631   BUN 14 08/10/2021 0631   CREATININE 1.07 08/10/2021 0631   CALCIUM 8.8 (L) 08/10/2021 0631   PROT 7.8 08/09/2021 1343   ALBUMIN 4.5 08/09/2021 1343   AST 36 08/09/2021 1343   ALT 27 08/09/2021 1343   ALKPHOS 53 08/09/2021 1343   BILITOT 0.7 08/09/2021 1343   GFRNONAA >60 08/10/2021 0631   No results found for: "CHOL", "HDL", "LDLCALC", "LDLDIRECT", "TRIG" No results found for: "HGBA1C" No results found for: "VITAMINB12" Lab Results  Component Value Date   TSH 2.577 08/09/2021   MRI Brain  with contrast 08/10/21 No abnormal enhancement identified. No dural thickening. No hippocampal enhancement Negative postcontrast MRI Brain.   Routine EEG 08/11/21 This study is within normal limits. No seizures or epileptiform discharges were seen throughout the recording.    ASSESSMENT AND PLAN  56 y.o. year old male  with history of hyperlipidemia who is presenting for seizure follow-up.  Currently he is on 500 mg of Keppra nightly denies any seizures and side effect from the medication.  At this time, I told patient that we can continue with Keppra 500 mg daily.  I will see him in 1 year.  If he continues to be seizure-free, we will discuss about coming off the medication.  He voices understanding.  Contact us if you do have a breakthrough seizure.      1. Seizure disorder [facility])     Patient Instructions  Continue with Keppra 500 mg daily Continue with your other medications Continue to follow with your PCP Contact us if you have a breakthrough seizure otherwise follow up in 1 year.  Patient cleared to drive since he has been seizure free for the past 6 months. Letter provided    Per [facility] statutes, patients with seizures are not allowed to drive until they have been  seizure-free for six months.  Other recommendations include using caution when using heavy equipment or power tools. Avoid working on ladders or at heights. Take showers instead of baths.  Do not swim alone.  Ensure the water temperature is not too high on the home water heater. Do not go swimming alone. Do not lock yourself in a room alone (i.e. bathroom). When caring for infants or small children, sit down when holding, feeding, or changing them to minimize risk of injury to the child in the event you have a seizure. Maintain good sleep hygiene. Avoid alcohol.  Also recommend adequate sleep, hydration, good diet and minimize stress.   During the Seizure  - First, ensure adequate ventilation and place patients on the floor on their left side  Loosen clothing around the neck and ensure the airway is patent. If the patient is clenching the teeth, do not force the mouth open with any object as this can cause severe damage - Remove all items from the surrounding that can be hazardous. The patient may be oblivious to what's happening and may not even know what he or she is doing. If the patient is confused and wandering, either gently guide him/her away and block access to outside areas - Reassure the individual and be comforting - Call 911. In most cases, the seizure ends before EMS arrives. However, there are cases when seizures may last over 3 to 5 minutes. Or the individual may have developed breathing difficulties or severe injuries. If a pregnant patient or a person with diabetes develops a seizure, it is prudent to call an ambulance. - Finally, if the patient does not regain full consciousness, then call EMS. Most patients will remain confused for about 45 to 90 minutes after a seizure, so you must use judgment in calling for help. - Avoid restraints but make sure the patient is in a bed with padded side rails - Place the individual in a lateral position with the neck slightly flexed; this will help  the saliva drain from the mouth and prevent the tongue from falling backward - Remove all nearby furniture and other hazards from the area - Provide verbal assurance as the individual is regaining consciousness - Provide the  patient with privacy if possible - Call for help and start treatment as ordered by the caregiver   After the Seizure (Postictal Stage)  After a seizure, most patients experience confusion, fatigue, muscle pain and/or a headache. Thus, one should permit the individual to sleep. For the next few days, reassurance is essential. Being calm and helping reorient the person is also of importance.  Most seizures are painless and end spontaneously. Seizures are not harmful to others but can lead to complications such as stress on the lungs, brain and the heart. Individuals with prior lung problems may develop labored breathing and respiratory distress.     No orders of the defined types were placed in this encounter.   Meds ordered this encounter  Medications   levETIRAcetam (KEPPRA) 500 MG tablet    Sig: Take 1 tablet (500 mg total) by mouth 2 (two) times daily.    Dispense:  180 tablet    Refill:  2    Return in about 1 year (around 02/02/2023).     Alric Ran, MD 02/01/2022, 1:16 PM  [facility] [address] [place], [place] [address] [phone]

## 2022-02-01 NOTE — Patient Instructions (Addendum)
Continue with Keppra 500 mg daily Continue with your other medications Continue to follow with your PCP Contact us if you have a breakthrough seizure otherwise follow up in 1 year.  Patient cleared to drive since he has been seizure free for the past 6 months. Letter provided

## 2022-03-02 ENCOUNTER — Ambulatory Visit: Payer: Managed Care, Other (non HMO) | Admitting: Neurology

## 2022-03-16 ENCOUNTER — Ambulatory Visit: Payer: Managed Care, Other (non HMO) | Admitting: Neurology

## 2022-05-10 DIAGNOSIS — Z0000 Encounter for general adult medical examination without abnormal findings: Secondary | ICD-10-CM | POA: Diagnosis not present

## 2022-05-10 DIAGNOSIS — E782 Mixed hyperlipidemia: Secondary | ICD-10-CM | POA: Diagnosis not present

## 2022-05-10 DIAGNOSIS — Z125 Encounter for screening for malignant neoplasm of prostate: Secondary | ICD-10-CM | POA: Diagnosis not present

## 2022-06-18 DIAGNOSIS — R945 Abnormal results of liver function studies: Secondary | ICD-10-CM | POA: Diagnosis not present

## 2023-02-02 ENCOUNTER — Encounter: Payer: Self-pay | Admitting: Neurology

## 2023-02-02 ENCOUNTER — Ambulatory Visit: Payer: BC Managed Care – PPO | Admitting: Neurology

## 2023-06-15 DIAGNOSIS — Z0000 Encounter for general adult medical examination without abnormal findings: Secondary | ICD-10-CM | POA: Diagnosis not present

## 2023-06-15 DIAGNOSIS — E782 Mixed hyperlipidemia: Secondary | ICD-10-CM | POA: Diagnosis not present

## 2023-06-15 DIAGNOSIS — Z125 Encounter for screening for malignant neoplasm of prostate: Secondary | ICD-10-CM | POA: Diagnosis not present
